# Patient Record
Sex: Female | Born: 1995 | Race: Black or African American | Hispanic: No | Marital: Single | Smoking: Former smoker
Health system: Southern US, Community
[De-identification: ages and names within clinical notes are randomized; demographics above are authoritative.]

## PROBLEM LIST (undated history)

## (undated) DIAGNOSIS — T85848A Pain due to other internal prosthetic devices, implants and grafts, initial encounter: Secondary | ICD-10-CM

## (undated) DIAGNOSIS — J45909 Unspecified asthma, uncomplicated: Secondary | ICD-10-CM

## (undated) HISTORY — PX: TONSILLECTOMY: SUR1361

## (undated) HISTORY — PX: CHOLECYSTECTOMY: SHX55

---
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
## 2013-08-10 ENCOUNTER — Ambulatory Visit: Payer: 59 | Admitting: Internal Medicine

## 2013-08-10 ENCOUNTER — Ambulatory Visit: Payer: 59

## 2013-08-10 VITALS — BP 128/70 | HR 89 | Temp 98.8°F | Resp 18 | Ht 62.4 in | Wt 254.8 lb

## 2013-08-10 DIAGNOSIS — M25469 Effusion, unspecified knee: Secondary | ICD-10-CM

## 2013-08-10 DIAGNOSIS — Z6841 Body Mass Index (BMI) 40.0 and over, adult: Secondary | ICD-10-CM

## 2013-08-10 DIAGNOSIS — M25462 Effusion, left knee: Secondary | ICD-10-CM

## 2013-08-10 DIAGNOSIS — M25569 Pain in unspecified knee: Secondary | ICD-10-CM

## 2013-08-10 DIAGNOSIS — M25562 Pain in left knee: Secondary | ICD-10-CM

## 2013-08-10 MED ORDER — HYDROCODONE-ACETAMINOPHEN 5-325 MG PO TABS: 1.0000 | ORAL_TABLET | Freq: Four times a day (QID) | ORAL | Status: DC | PRN

## 2013-08-10 NOTE — Progress Notes (Signed)
Subjective:     Patient ID: Samantha Allen, female   DOB: 1996/03/10, 18 y.o.   MRN: [id number]  Knee Pain    18 YO AA female [facility] with left knee pain after landing awkwardly during a basketball game yesterday evening. She states she went up for a rebound and as she came down she felt a pop in her knee and immediately fell to the ground. She does not remember the exact mechanism of the fall but states that she is having trouble weight bearing, is walking with a limp, has had her knee give out on her, but denies any locking of the knee. She has never hurt her left knee before and has no surgeries in the past that involve her knees. He has tried taking ibuprofen for the pain with minimal relief.    Review of Systems  Constitutional: Positive for activity change. Negative for fever, chills, diaphoresis, appetite change and fatigue.  HENT: Negative for congestion, ear pain, postnasal drip, rhinorrhea and sinus pressure.   Eyes: Negative for pain and redness.  Respiratory: Negative for cough, chest tightness, shortness of breath and wheezing.   Cardiovascular: Negative for chest pain.       Left knee swelling  Gastrointestinal: Negative for nausea, abdominal pain, diarrhea, constipation and abdominal distention.  Genitourinary: Negative for dysuria, urgency and hematuria.  Musculoskeletal: Positive for joint swelling. Negative for arthralgias and myalgias.  Skin: Negative for rash.  Neurological: Negative for dizziness, weakness, light-headedness and headaches.   Current Outpatient Prescriptions on File Prior to Visit  Medication Sig Dispense Refill  . ibuprofen (ADVIL,MOTRIN) 200 MG tablet Take 200 mg by mouth every 6 (six) hours as needed. Pain/ headache       No current facility-administered medications on file prior to visit.   Past Medical History  Diagnosis Date  . Pain from implanted hardware 11/2011    retained syndesmosis screw right ankle       Objective:   Physical  Exam  Constitutional: She is oriented to person, place, and time. She appears well-developed and well-nourished. No distress.  HENT:  Head: Normocephalic and atraumatic.  Right Ear: External ear normal.  Left Ear: External ear normal.  Mouth/Throat: Oropharynx is clear and moist.  Eyes: Conjunctivae are normal. Pupils are equal, round, and reactive to light.  Cardiovascular: Normal rate, regular rhythm, normal heart sounds and intact distal pulses.  Exam reveals no gallop and no friction rub.   No murmur heard. Pulmonary/Chest: Effort normal and breath sounds normal. No respiratory distress. She has no wheezes. She has no rales.  Abdominal: Soft. Bowel sounds are normal. She exhibits no distension. There is no tenderness. There is no rebound and no guarding.  Musculoskeletal: She exhibits edema and tenderness.  Decreased ROM with flexion and extension of the left knee Significant tenderness along medial and lateral joint lines with anterior medial effusion unable to fully weight bear with very abnormal gait Significant laxity with lachman's and anterior drawer Pain with valgus and varus stress test  Neurological: She is alert and oriented to person, place, and time. No cranial nerve deficit.  Skin: Skin is warm and dry. No rash noted. She is not diaphoretic. No erythema.  BP 128/70  Pulse 89  Temp(Src) 98.8 F (37.1 C) (Oral)  Resp 18  Ht 5' 2.4" (1.585 m)  Wt 254 lb 12.8 oz (115.577 kg)  BMI 46.01 kg/m2  SpO2 98%  LMP 08/05/2013  [facility] reading (PRIMARY) by  Dr.Lynzi Meulemans=no bony abnormality  Assessment:     Left knee pain - Plan: DG Knee Complete 4 Views Left  Knee effusion, left - Plan: DG Knee Complete 4 Views Left       Plan:     Meds ordered this encounter  Medications  . HYDROcodone-acetaminophen (NORCO) 5-325 MG per tablet    Sig: Take 1 tablet by mouth every 6 (six) hours as needed for moderate pain.    Dispense:  20 tablet    Refill:  0   MRI to r/o  suspected Int derang Straight leg immobilizer Crutches with nonweightbearing     I have completed the patient encounter in its entirety as documented by MS4 Adams-Teagan Ozawa, with editing by me where necessary. Candiace West P. Merla Richesoolittle, M.D.

## 2013-08-12 ENCOUNTER — Telehealth: Payer: Self-pay | Admitting: *Deleted

## 2013-08-12 ENCOUNTER — Other Ambulatory Visit: Payer: Self-pay | Admitting: Radiology

## 2013-08-12 DIAGNOSIS — M25569 Pain in unspecified knee: Secondary | ICD-10-CM

## 2013-08-12 NOTE — Telephone Encounter (Signed)
Discussed with Dr Merla Richesoolittle, will refer to [facility] ? ACL tear needs ortho eval and they can order MRI scan.

## 2013-08-12 NOTE — Telephone Encounter (Signed)
Peer to Peer review requested. Patient ID [id number] Case # [id number] Phone number: [phone] option 3  Review required due to original request has not demonstrated consistency with evidence based clinical guidelines.   MRI Left knee without contrast

## 2013-08-31 ENCOUNTER — Inpatient Hospital Stay: Admission: RE | Admit: 2013-08-31 | Payer: 59 | Source: Ambulatory Visit

## 2013-09-10 ENCOUNTER — Encounter (HOSPITAL_COMMUNITY): Payer: Self-pay | Admitting: Emergency Medicine

## 2013-09-10 ENCOUNTER — Emergency Department (HOSPITAL_COMMUNITY): Payer: 59

## 2013-09-10 ENCOUNTER — Emergency Department (HOSPITAL_COMMUNITY)
Admission: EM | Admit: 2013-09-10 | Discharge: 2013-09-11 | Disposition: A | Discharge status: Home / Self Care | Payer: 59 | Attending: Emergency Medicine | Admitting: Emergency Medicine

## 2013-09-10 DIAGNOSIS — X500XXA Overexertion from strenuous movement or load, initial encounter: Secondary | ICD-10-CM | POA: Insufficient documentation

## 2013-09-10 DIAGNOSIS — S8992XA Unspecified injury of left lower leg, initial encounter: Secondary | ICD-10-CM

## 2013-09-10 DIAGNOSIS — M25562 Pain in left knee: Secondary | ICD-10-CM

## 2013-09-10 DIAGNOSIS — Y9389 Activity, other specified: Secondary | ICD-10-CM | POA: Insufficient documentation

## 2013-09-10 DIAGNOSIS — Y929 Unspecified place or not applicable: Secondary | ICD-10-CM | POA: Insufficient documentation

## 2013-09-10 DIAGNOSIS — F17200 Nicotine dependence, unspecified, uncomplicated: Secondary | ICD-10-CM | POA: Insufficient documentation

## 2013-09-10 DIAGNOSIS — IMO0002 Reserved for concepts with insufficient information to code with codable children: Secondary | ICD-10-CM | POA: Insufficient documentation

## 2013-09-10 IMAGING — CR DG KNEE COMPLETE 4+V*L*
4 series · 4 of 4 positions shown · non-contrast
Comparison: None.

CLINICAL DATA: Injury to left knee while playing basketball. Left
knee pain.

EXAM:
LEFT KNEE - COMPLETE 4+ VIEW

[t knee ap left]
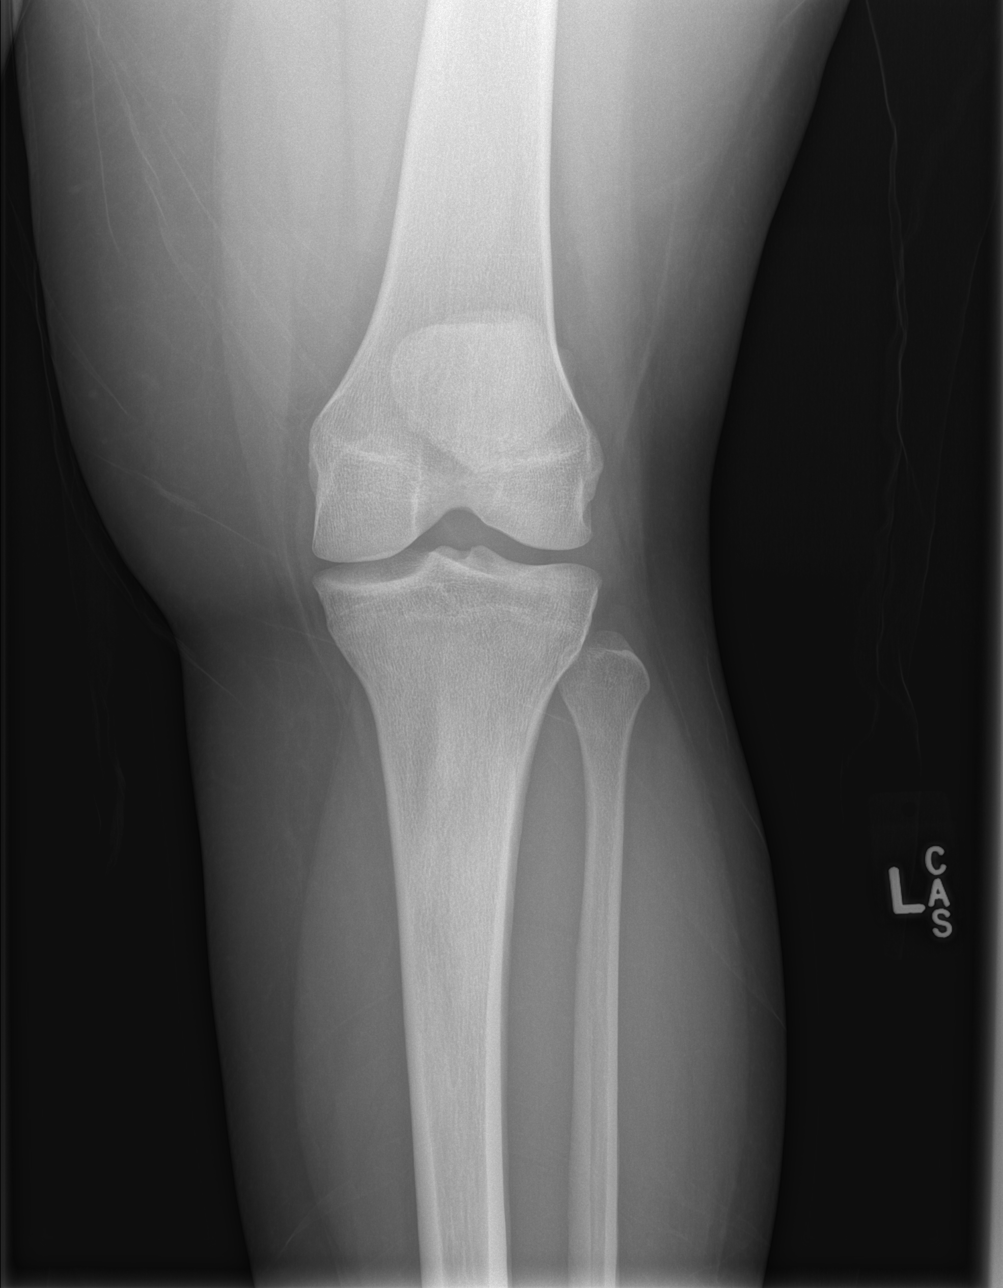

[t knee obl left (1 of 2)]
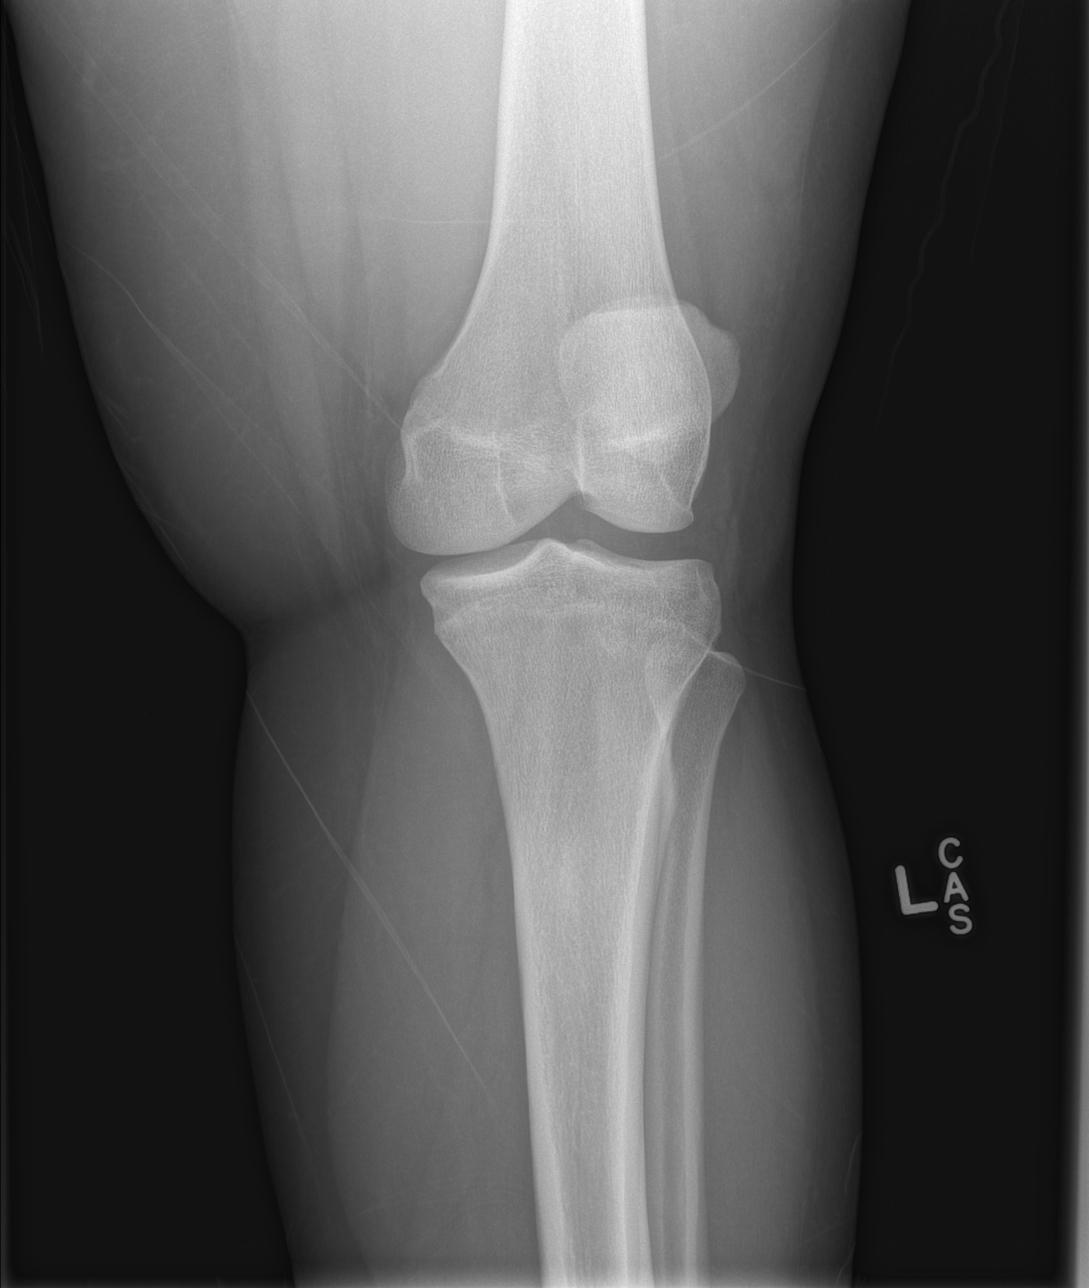

[t knee obl left (2 of 2)]
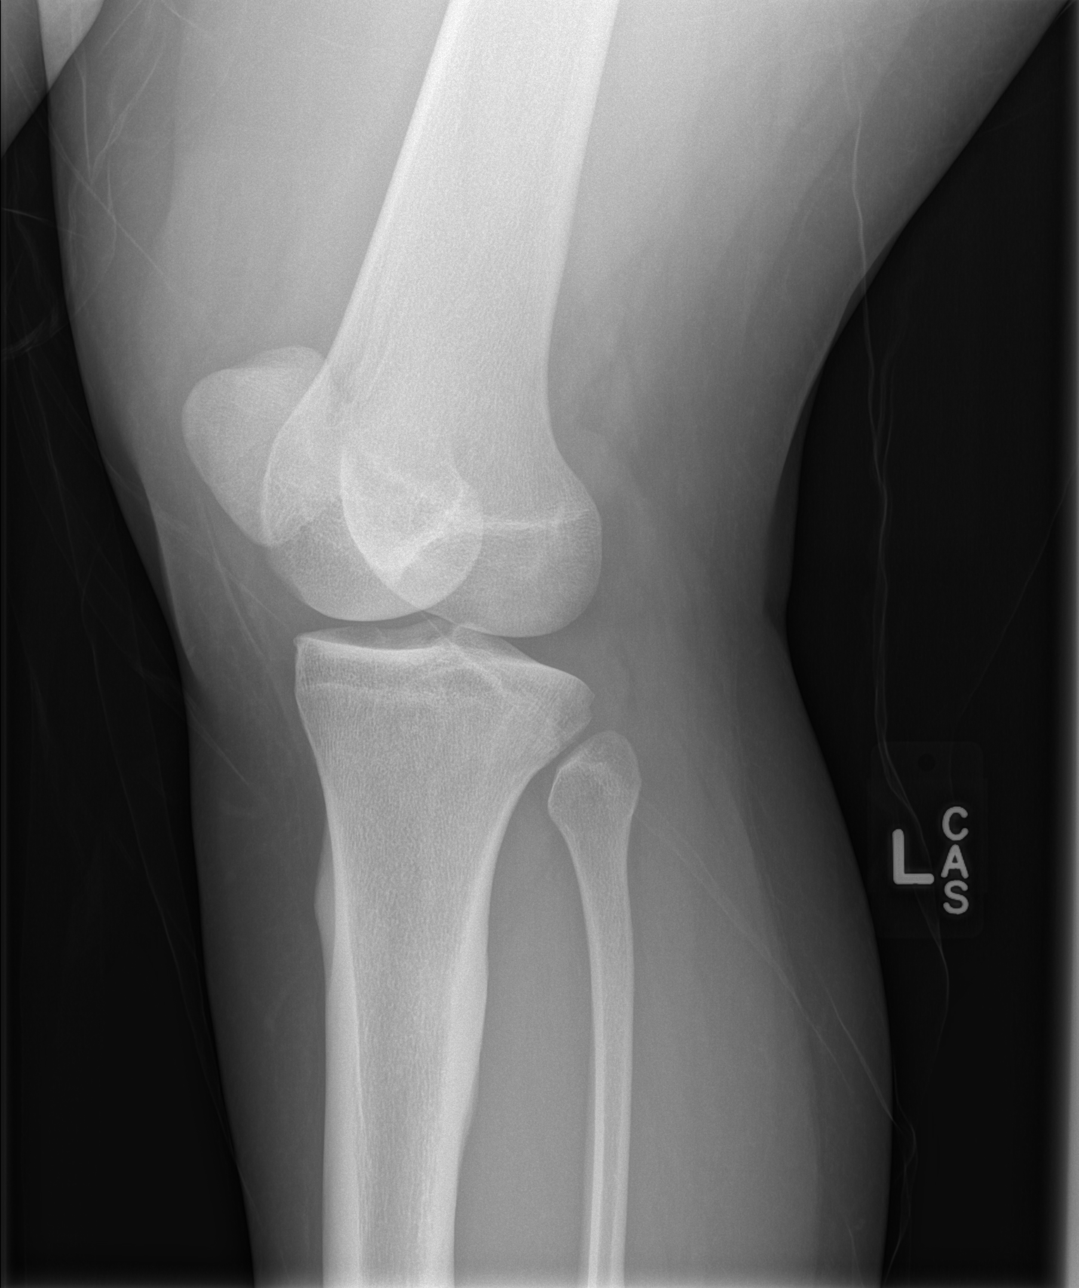

[t knee lat left]
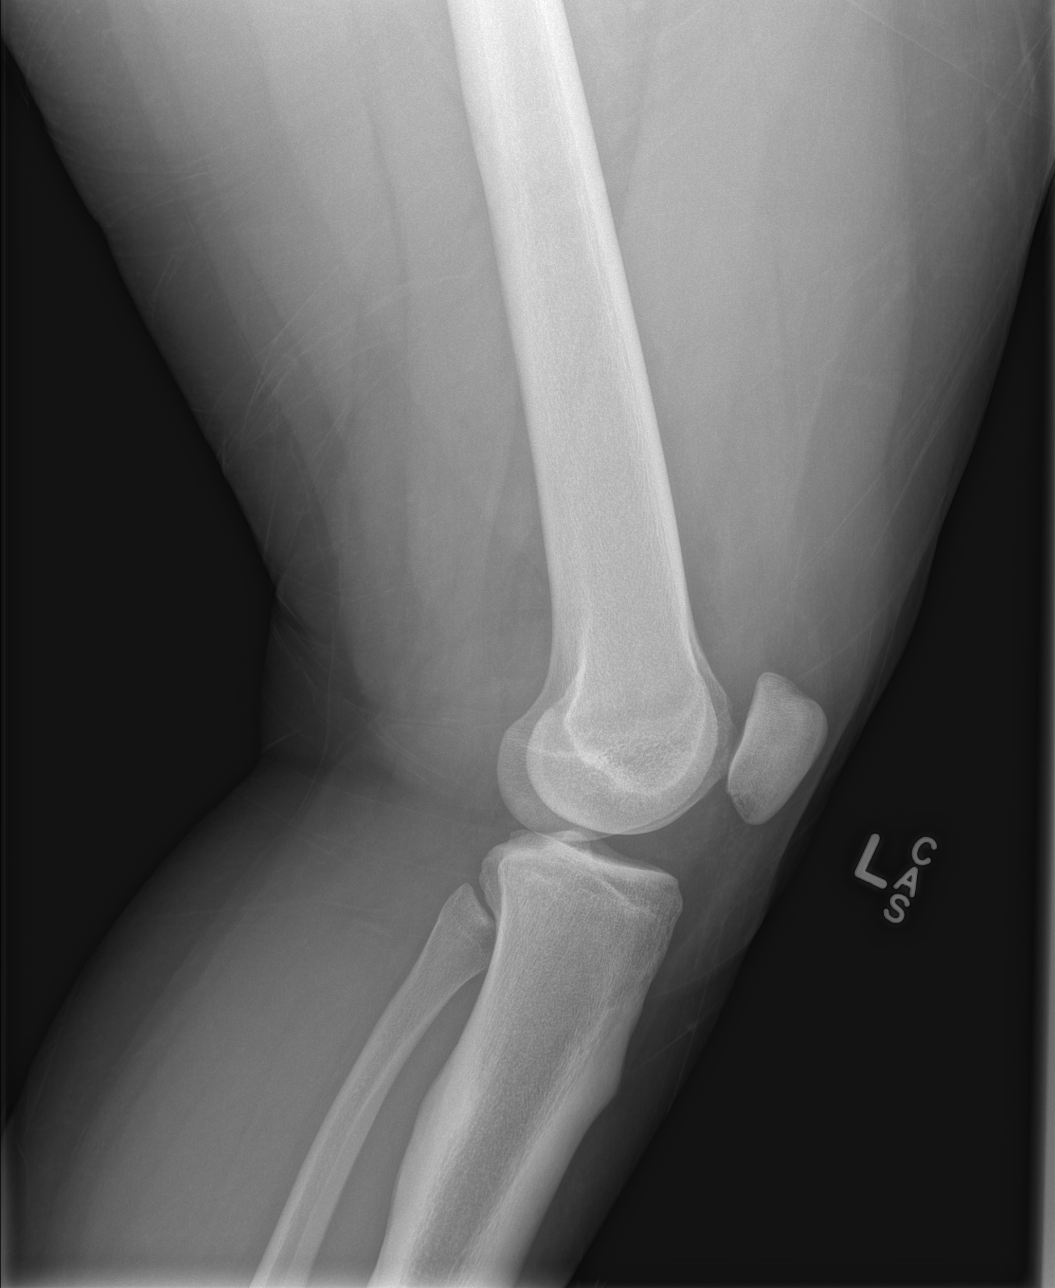

[4 of 4 positions shown; findings below may reference images not displayed]

FINDINGS: There is no evidence of fracture or dislocation. The joint spaces
are preserved. No significant degenerative change is seen; the
patellofemoral joint is grossly unremarkable in appearance.

A small knee joint effusion is noted. The visualized soft tissues
are otherwise unremarkable in appearance.
IMPRESSION: 1. No evidence of fracture or dislocation.
2. Small knee joint effusion noted.

## 2013-09-10 MED ORDER — NAPROXEN 500 MG PO TABS: 500.0000 mg | ORAL_TABLET | Freq: Two times a day (BID) | ORAL | Status: DC

## 2013-09-10 MED ORDER — TRAMADOL HCL 50 MG PO TABS: 50.0000 mg | ORAL_TABLET | Freq: Four times a day (QID) | ORAL | Status: DC | PRN

## 2013-09-10 NOTE — ED Provider Notes (Signed)
CSN: [id number]     Arrival date & time 09/10/13  2038 History   None    This chart was scribed for non-physician practitioner, Antony MaduraKelly Abria Vannostrand, PA-C working with Loren Raceravid Yelverton, MD by Arlan OrganAshley Leger, ED Scribe. This patient was seen in room WTR5/WTR5 and the patient's care was started at 11:13 PM.   Chief Complaint  Patient presents with  . Knee Pain   HPI  HPI Comments: Samantha CotaBriana Allen is a 18 y.o. female who presents to the Emergency Department complaining of constant, moderate L knee pain x 1 day that has progressively worsened. She has also noted some swelling to the knee. Pt states she injured her knee about 1 month ago while playing basketball and says it has not "felt right" since onset of injury. States when she was jogging yesterday she felt her knee "shift". She states this new pain is exacerbated with ambulation, certain movements, flexion of the knee, and palpation. She has tried OTC Tylenol without any improvement. She has also tried ice to the area along with elevating her knee without any relief.   At this time she denies any fever, chills, numbness, loss of sensation, or tingling. She has no pertinent past medical history. No other concerns this visit.   Past Medical History  Diagnosis Date  . Pain from implanted hardware 11/2011    retained syndesmosis screw right ankle   Past Surgical History  Procedure Laterality Date  . Cholecystectomy    . Tonsillectomy    . Orif ankle fracture  07/2011    right  . Hardware removal  11/22/2011    Procedure: HARDWARE REMOVAL;  Surgeon: Toni ArthursJohn Hewitt, MD;  Location:  [facility];  Service: Orthopedics;  Laterality: Right;   Family History  Problem Relation Age of Onset  . Anesthesia problems Mother     post-op N/V   History  Substance Use Topics  . Smoking status: Current Some Day Smoker  . Smokeless tobacco: Never Used  . Alcohol Use: No   OB History   Grav Para Term Preterm Abortions TAB SAB Ect Mult Living                   Review of Systems  Constitutional: Negative for fever and chills.  HENT: Negative for congestion.   Eyes: Negative for redness.  Respiratory: Negative for cough.   Musculoskeletal: Positive for arthralgias (L knee).  Skin: Negative for rash.  Neurological: Negative for numbness.  Psychiatric/Behavioral: Negative for confusion.     Allergies  Review of patient's allergies indicates no known allergies.  Home Medications   Prior to Admission medications   Medication Sig Start Date End Date Taking? Authorizing Provider  HYDROcodone-acetaminophen (NORCO) 5-325 MG per tablet Take 1 tablet by mouth every 6 (six) hours as needed for moderate pain. 08/10/13   Tonye Pearsonobert P Doolittle, MD  ibuprofen (ADVIL,MOTRIN) 200 MG tablet Take 200 mg by mouth every 6 (six) hours as needed. Pain/ headache    Historical Provider, MD  naproxen (NAPROSYN) 500 MG tablet Take 1 tablet (500 mg total) by mouth 2 (two) times daily. 09/10/13   Antony MaduraKelly Kellen Dutch, PA-C  traMADol (ULTRAM) 50 MG tablet Take 1 tablet (50 mg total) by mouth every 6 (six) hours as needed. 09/10/13   Antony MaduraKelly Ardenia Stiner, PA-C   Triage Vitals: BP 123/84  Pulse 84  Temp(Src) 98.1 F (36.7 C) (Oral)  Resp 14  SpO2 97%  LMP 08/17/2013   Physical Exam  Nursing note and vitals reviewed. Constitutional: She  is oriented to person, place, and time. She appears well-developed and well-nourished. No distress.  HENT:  Head: Normocephalic and atraumatic.  Eyes: Conjunctivae and EOM are normal. No scleral icterus.  Neck: Normal range of motion.  Cardiovascular: Normal rate, regular rhythm and intact distal pulses.   Pulses:      Dorsalis pedis pulses are 2+ on the left side.       Posterior tibial pulses are 2+ on the left side.  Pulmonary/Chest: Effort normal. No respiratory distress.  Abdominal: She exhibits no distension.  Musculoskeletal: Normal range of motion. She exhibits tenderness.       Left knee: She exhibits swelling (mild). She exhibits normal  range of motion, no effusion, no ecchymosis, no deformity, no erythema, normal alignment, no LCL laxity, no bony tenderness and no MCL laxity. Tenderness found. Medial joint line tenderness noted.       Left upper leg: Normal.       Left lower leg: Normal.  Full PROM of L knee. TTP to medial joint line. No bony TTP. No laxity, crepitus, deformity, or effusion.  Neurological: She is alert and oriented to person, place, and time. She has normal strength. No sensory deficit. GCS eye subscore is 4. GCS verbal subscore is 5. GCS motor subscore is 6.  Reflex Scores:      Patellar reflexes are 2+ on the left side.      Achilles reflexes are 2+ on the left side. No numbness, tingling or weakness of the affected extremity. 5/5 strength against resistance with L knee flexion and extension.  Skin: Skin is warm and dry. No rash noted. She is not diaphoretic. No erythema. No pallor.  Psychiatric: She has a normal mood and affect. Her behavior is normal.    ED Course  Procedures (including critical care time)  DIAGNOSTIC STUDIES: Oxygen Saturation is 97% on RA, Normal by my interpretation.    COORDINATION OF CARE: 11:22 PM- Will order DG Knee complete 4 view L. Discussed treatment plan with pt at bedside and pt agreed to plan.     Labs Review Labs Reviewed - No data to display  Imaging Review Dg Knee Complete 4 Views Left  09/10/2013   CLINICAL DATA:  Injury to left knee while playing basketball. Left knee pain.  EXAM: LEFT KNEE - COMPLETE 4+ VIEW  COMPARISON:  None.  FINDINGS: There is no evidence of fracture or dislocation. The joint spaces are preserved. No significant degenerative change is seen; the patellofemoral joint is grossly unremarkable in appearance.  A small knee joint effusion is noted. The visualized soft tissues are otherwise unremarkable in appearance.  IMPRESSION: 1. No evidence of fracture or dislocation. 2. Small knee joint effusion noted.   Electronically Signed   By: Roanna Raider M.D.   On: 09/10/2013 21:56     EKG Interpretation None      MDM   Final diagnoses:  Injury of ligament of left knee  Knee pain, left    Uncomplicated left knee pain. Patient neurovascularly intact in physical exam. No gross sensory deficits appreciated. No evidence of septic joint; patient exhibits full passive range of motion of her left knee. She relates with normal gait. X-ray today is negative for fracture or dislocation. Knee sleeve applied in ED and RICE advised. Will refer patient to orthopedics for further evaluation of symptoms. Return precautions discussed and provided. Patient agreeable to plan with no unaddressed concerns.  I personally performed the services described in this documentation, which was scribed  in my presence. The recorded information has been reviewed and is accurate.   Filed Vitals:   09/10/13 2104 09/11/13 0003  BP: 123/84 140/85  Pulse: 84 70  Temp: 98.1 F (36.7 C)   TempSrc: Oral   Resp: 14 18  SpO2: 97% 100%     Antony Madura, PA-C 09/13/13 [phone]

## 2013-09-10 NOTE — Discharge Instructions (Signed)
Recommend naproxen as prescribed. You may take tramadol as prescribed for severe pain. Use a knee sleeve or Ace wrap for stability. Continue with ice, rest, and elevation as described below. Followup with orthopedics. Return if symptoms worsen.  RICE: Routine Care for Injuries Rest, Ice, Compression, and Elevation (RICE) are often used to care for injuries. HOME CARE  Rest your injury.  Put ice on the injury.  Put ice in a plastic bag.  Place a towel between your skin and the bag.  Leave the ice on for 15-20 minutes, 03-04 times a day. Do this for as long as told by your doctor.  Apply pressure (compression) with an elastic bandage. Remove and reapply the bandage every 3 to 4 hours. Do not wrap the bandage too tight. Wrap the bandage looser if the fingers or toes are puffy (swollen), blue, cold, painful, or lose feeling (numb).  Raise (elevate) your injury. Raise your injury above the heart if you can. GET HELP RIGHT AWAY IF:  You have lasting pain or puffiness.  Your injury is red, weak, or loses feeling.  Your problems get worse, not better, after several days. MAKE SURE YOU:  Understand these instructions.  Will watch your condition.  Will get help right away if you are not doing well or get worse. Document Released: 09/19/2007 Document Revised: 06/25/2011 Document Reviewed: 09/01/2010 [facility] Patient Information 2014 [place], [place].  Medial Collateral Knee Ligament Sprain  with Phase I Rehab The medial collateral ligament (MCL) of the knee helps hold the knee joint in proper alignment and prevents the bones from shifting out of alignment (displacing) to the inside (medially). Injury to the knee may cause a tear in the MCL ligament (sprain). Sprains may heal without treatment, but this often results in a loose joint. Sprains are classified into three categories. Grade 1 sprains cause pain, but the tendon is not lengthened. Grade 2 sprains include a lengthened ligament, due  to the ligament being stretched or partially ruptured. With grade 2 sprains, there is still function, although possibly decreased. Grade 3 sprains involve a complete tear of the tendon or muscle, and function is usually impaired. SYMPTOMS   Pain and tenderness on the inner side of the knee.  A "pop," tearing or pulling sensation at the time of injury.  Bruising (contusion) at the site of injury, within 48 hours of injury.  Knee stiffness.  Limping, often walking with the knee bent. CAUSES  An MCL sprain occurs when a force is placed on the ligament that is greater than it can handle. Common mechanisms of injury include:  Direct hit (trauma) to the outer side of the knee, especially if the foot is planted on the ground.  Forceful pivoting of the body and leg, while the foot is planted on the ground. RISK INCREASES WITH:  Contact sports (football, rugby).  Sports that require pivoting or cutting (soccer).  Poor knee strength and flexibility.  Improper equipment use. PREVENTION  Warm up and stretch properly before activity.  Maintain physical fitness:  Strength, flexibility and endurance.  Cardiovascular fitness.  Wear properly fitted protective equipment (correct length of cleats for surface).  Functional braces may be effective in preventing injury. PROGNOSIS  MCL tears usually heal without the need for surgery. Sometimes however, surgery is required. RELATED COMPLICATIONS  Frequently recurring symptoms, such as the knee giving way, knee instability or knee swelling.  Injury to other structures in the knee joint:  Meniscal cartilage, resulting in locking and swelling of the knee.  Articular cartilage, resulting in knee arthritis.  Other ligaments of the knee.  Injury to nerves, resulting in numbness of the outer leg, foot or ankle and weakness or paralysis, with inability to raise the ankle or toes.  Knee stiffness. TREATMENT Treatment first involves the use  of ice and medicine, to reduce pain and inflammation. The use of strengthening and stretching exercises may help reduce pain with activity. These exercises may be performed at home, but referral to a therapist is often advised. You may be advised to walk with crutches until you are able to walk without a limp. Your caregiver may provide you with a hinged knee brace to help regain a full range of motion, while also protecting the injured knee. For severe MCL injuries or injuries that involve other ligaments of the knee, surgery is often advised. MEDICATION  Do not take pain medicine for 7 days before surgery.  Only use over-the-counter pain medicine as directed by your caregiver.  Only use prescription pain relievers as directed and only in needed amounts. HEAT AND COLD  Cold treatment (icing) should be applied for 10 to 15 minutes every 2 to 3 hours for inflammation and pain, and immediately after any activity, that aggravates the symptoms. Use ice packs or an ice massage.  Heat treatment may be used before performing stretching and strengthening activities prescribed by your caregiver, physical therapist or athletic trainer. Use a heat pack or warm water soak. SEEK MEDICAL CARE IF:   Symptoms get worse or do not improve in 4 to 6 weeks, despite treatment.  New, unexplained symptoms develop. EXERCISES  PHASE I EXERCISES  RANGE OF MOTION (ROM) AND STRETCHING EXERCISES Medial Collateral Knee Ligament Sprain Phase I These are some of the initial exercises that your physician, physical therapist or athletic trainer may have you perform to begin your rehabilitation. When you demonstrate gains in your flexibility and strength, your caregiver may progress you to Phase II exercises. As you perform these exercises, remember:  These initial exercises are intended to be gentle. They will help you restore motion without increasing any swelling.  Completing these exercises allows less painful movement  and prepares you for the more aggressive strengthening exercises in Phase II.  An effective stretch should be held for at least 30 seconds.  A stretch should never be painful. You should only feel a gentle lengthening or release in the stretched tissue. RANGE OF MOTION Knee Flexion, Active  Lie on your back with both knees straight. (If this causes back discomfort, bend your healthy knee, placing your foot flat on the floor.)  Slowly slide your heel back toward your buttocks until you feel a gentle stretch in the front of your knee or thigh.  Hold for __________ seconds. Slowly slide your heel back to the starting position. Repeat __________ times. Complete this exercise __________ times per day. STRETCH Knee Flexion, Supine  Lie on the floor with your right / left heel and foot lightly touching the wall. (Place both feet on the wall if you do not use a door frame.)  Without using any effort, allow gravity to slide your foot down the wall slowly until you feel a gentle stretch in the front of your right / left knee.  Hold this stretch for __________ seconds. Then return the leg to the starting position, using your health leg for help, if needed. Repeat __________ times. Complete this stretch __________ times per day. RANGE OF MOTION Knee Flexion and Extension, Active-Assisted  Sit on the  edge of a table or chair with your thighs firmly supported. It may be helpful to place a folded towel under the end of your right / left thigh.  Flexion (bending): Place the ankle of your healthy leg on top of the other ankle. Use your healthy leg to gently bend your right / left knee until you feel a mild tension across the top of your knee.  Hold for __________ seconds.  Extension (straightening): Switch your ankles so your right / left leg is on top. Use your healthy leg to straighten your right / left knee until you feel a mild tension on the backside of your knee.  Hold for __________  seconds. Repeat __________ times. Complete this exercise __________ times per day. STRETCH Knee Extension Sitting  Sit with your right / left leg/heel propped on another chair, coffee table, or foot stool.  Allow your leg muscles to relax, letting gravity straighten out your knee.*  You should feel a stretch behind your right / left knee. Hold this position for __________ seconds. Repeat __________ times. Complete this stretch __________ times per day. *Your physician, physical therapist or athletic trainer may instruct you to place a __________ weight on your thigh, just above your kneecap, to deepen the stretch. STRENGTHENING EXERCISES Medial Collateral Knee Ligament Sprain Phase I These exercises may help you when beginning to rehabilitate your injury. They may resolve your symptoms with or without further involvement from your physician, physical therapist or athletic trainer. While completing these exercises, remember:   In order to return to more demanding activities, you will likely need to progress to more challenging exercises. Your physician, physical therapist or athletic trainer will advance your exercises when your tissues show adequate healing and your muscles demonstrate increased strength.  Muscles can gain both the endurance and the strength needed for everyday activities through controlled exercises.  Complete these exercises as instructed by your physician, physical therapist or athletic trainer. Increase the resistance and repetitions only as guided by your caregiver. STRENGTH Quadriceps, Isometrics  Lie on your back with your right / left leg extended and your opposite knee bent.  Gradually tense the muscles in the front of your right / left thigh. You should see either your kneecap slide up toward your hip or an increased dimpling just above the knee. This motion will push the back of the knee down toward the floor, mat or bed on which you are lying.  Hold the muscle as  tight as you can without increasing your pain for __________ seconds.  Relax the muscles slowly and completely in between each repetition. Repeat __________ times. Complete this exercise __________ times per day. STRENGTH Quadriceps, Short Arcs  Lie on your back. Place a __________ inch towel roll under your knee so that the knee slightly bends.  Raise only your lower leg by tightening the muscles in the front of your thigh. Do not allow your thigh to rise.  Hold this position for __________ seconds. Repeat __________ times. Complete this exercise __________ times per day. OPTIONAL ANKLE WEIGHTS: Begin with ____________________, but DO NOT exceed ____________________. Increase in 1 pound/0.5 kilogram increments.  STRENGTH- Quadriceps, Straight Leg Raises Quality counts! Watch for signs that the quadriceps muscle is working, to be sure you are strengthening the correct muscles and not "cheating" by substituting with healthier muscles.  Lay on your back with your right / left leg extended and your opposite knee bent.  Tense the muscles in the front of your right / left  thigh. You should see either your knee cap slide up or increased dimpling just above the knee. Your thigh may even shake a bit.  Tighten these muscles even more and raise your leg 4 to 6 inches off the floor. Hold for __________ seconds.  Keeping these muscles tense, lower your leg.  Relax the muscles slowly and completely in between each repetition. Repeat __________ times. Complete this exercise __________ times per day. STRENGTH Hamstring, Isometrics  Lie on your back on a firm surface.  Bend your right / left knee approximately __________ degrees.  Dig your heel into the surface as if you are trying to pull it toward your buttocks. Tighten the muscles in the back of your thighs to "dig" as hard as you can, without increasing any pain.  Hold this position for __________ seconds.  Release the tension gradually and  allow your muscle to completely relax for __________ seconds in between each exercise. Repeat __________ times. Complete this exercise __________ times per day. STRENGTH Hamstring, Curls  Lay on your stomach with your legs extended. (If you lay on a bed, your feet may hang over the edge.)  Tighten the muscles in the back of your thigh to bend your right / left knee up to 90 degrees. Keep your hips flat on the bed.  Hold this position for __________ seconds.  Slowly lower your leg back to the starting position. Repeat __________ times. Complete this exercise __________ times per day. OPTIONAL ANKLE WEIGHTS: Begin with ____________________, but DO NOT exceed ____________________. Increase in 1 pound/0.5 kilogram increments.  Document Released: 04/02/2005 Document Revised: 06/25/2011 Document Reviewed: 07/15/2008 [facility] Patient Information 2014 [place], [place].

## 2013-09-10 NOTE — ED Notes (Signed)
Pt states that she injured her knee approx 1 month ago and that it has not "felt right"since; pt states that she was jogging yesterday and knee felt like it was "stuck" or "not in place" pt states that it felt like her leg had shifted; pt states increase pain since then.

## 2013-09-23 NOTE — ED Provider Notes (Signed)
Medical screening examination/treatment/procedure(s) were performed by non-physician practitioner and as supervising physician I was immediately available for consultation/collaboration.   EKG Interpretation None        Sanjuanita Condrey, MD 09/23/13 0207 

## 2014-02-24 ENCOUNTER — Inpatient Hospital Stay
Admit: 2014-02-24 | Discharge: 2014-02-24 | Disposition: A | Discharge status: Home / Self Care | Payer: PRIVATE HEALTH INSURANCE | Attending: Student in an Organized Health Care Education/Training Program

## 2014-02-24 DIAGNOSIS — J45901 Unspecified asthma with (acute) exacerbation: Secondary | ICD-10-CM

## 2014-02-24 MED ORDER — IPRATROPIUM-ALBUTEROL 2.5 MG-0.5 MG/3 ML NEB SOLUTION
2.5 mg-0.5 mg/3 ml | RESPIRATORY_TRACT | Status: AC
Start: 2014-02-24 — End: 2014-02-24
  Administered 2014-02-24: 19:00:00 via RESPIRATORY_TRACT

## 2014-02-24 MED ORDER — IBUPROFEN 400 MG TAB
400 mg | ORAL | Status: AC
Start: 2014-02-24 — End: 2014-02-24
  Administered 2014-02-24: 19:00:00 via ORAL

## 2014-02-24 MED ORDER — PREDNISONE 20 MG TAB
20 mg | ORAL | Status: AC
Start: 2014-02-24 — End: 2014-02-24
  Administered 2014-02-24: 19:00:00 via ORAL

## 2014-02-24 MED ORDER — PREDNISONE 20 MG TAB
20 mg | ORAL_TABLET | Freq: Every day | ORAL | Status: AC
Start: 2014-02-24 — End: 2014-02-28

## 2014-02-24 MED ORDER — ALBUTEROL SULFATE 0.083 % (0.83 MG/ML) SOLN FOR INHALATION
2.5 mg /3 mL (0.083 %) | RESPIRATORY_TRACT | Status: AC | PRN
Start: 2014-02-24 — End: ?

## 2014-02-24 MED FILL — ALBUTEROL SULFATE 0.083 % (0.83 MG/ML) SOLN FOR INHALATION: 2.5 mg /3 mL (0.083 %) | RESPIRATORY_TRACT | Qty: 1

## 2014-02-24 MED FILL — PREDNISONE 20 MG TAB: 20 mg | ORAL | Qty: 3

## 2014-02-24 MED FILL — IBUPROFEN 400 MG TAB: 400 mg | ORAL | Qty: 2

## 2014-02-24 NOTE — ED Notes (Signed)
Patient aware of plan of care

## 2014-02-24 NOTE — ED Notes (Signed)
Patient education given on home wheezing management and the patient expresses understanding and acceptance of instructions. Joretta BachelorJOSEPH J HULSEBUSCH, RN 02/24/2014 3:58 PM  Pt discharged home with parent/gaurdian.  Pt acting age appropriately, respirations regular and unlabored, cap refill less than two seconds.  Parent/gaurdian verbalized understanding of discharge paperwork and has no further questions at this time.

## 2014-02-24 NOTE — ED Provider Notes (Signed)
HPI Comments: 18 y/o female with hx asthma here with cough, chest pain, wheezing and sob; She has had these symptoms for the past 1 week. She has not needed any albuterol in about 2 years and she does not know have any albuterol for her neb machine so she has had no treatments. No fever; no v/d; She has upper chest pain intermittently, none now. No sob here. No abdominal pain, back pain, ha, st. She has had a normal appetite, drinking well.     Pmh: asthma; cholecystectomy, t and a, pe tubes  Social: vaccines utd; lives at home with family    Patient is a 18 y.o. female presenting with cough. The history is provided by the patient.   Cough  Associated symptoms include chest pain, shortness of breath and wheezing.        Past Medical History   Diagnosis Date   ??? Asthma    ;     Past Surgical History   Procedure Laterality Date   ??? Hx heent       Tonsillectomy and Adenoidectomy   ??? Hx cholecystectomy     ??? Hx orthopaedic       Right Ankle         History reviewed. No pertinent family history.     History     Social History   ??? Marital Status: N/A     Spouse Name: N/A     Number of Children: N/A   ??? Years of Education: N/A     Occupational History   ??? Not on file.     Social History Main Topics   ??? Smoking status: Current Every Day Smoker   ??? Smokeless tobacco: Not on file   ??? Alcohol Use: Not on file   ??? Drug Use: Not on file   ??? Sexual Activity: Not on file     Other Topics Concern   ??? Not on file     Social History Narrative   ??? No narrative on file                ALLERGIES: Review of patient's allergies indicates not on file.      Review of Systems   Constitutional: Negative.  Negative for fever and appetite change.   HENT: Negative.    Respiratory: Positive for cough, chest tightness, shortness of breath and wheezing.    Cardiovascular: Positive for chest pain.   Gastrointestinal: Negative.    Genitourinary: Negative.    Skin: Negative.    Neurological: Negative.     All other systems reviewed and are negative.      Filed Vitals:    02/24/14 1239   BP: 113/76   Pulse: 82   Temp: 98.1 ??F (36.7 ??C)   Resp: 20   Weight: 117.3 kg (258 lb 9.6 oz)   SpO2: 97%            Physical Exam   Constitutional: She is oriented to person, place, and time. She appears well-developed and well-nourished.   HENT:   Head: Normocephalic.   Right Ear: External ear normal.   Left Ear: External ear normal.   Mouth/Throat: Oropharynx is clear and moist. No oropharyngeal exudate.   Eyes: Conjunctivae are normal. Pupils are equal, round, and reactive to light.   Neck: Normal range of motion. Neck supple.   Cardiovascular: Normal rate, regular rhythm and normal heart sounds.    Pulmonary/Chest: No respiratory distress. She has wheezes.   Diffuse wheezing bilateral  with good air exchange; no distress, no tachypnea or retractions. No rales;    Abdominal: Soft. Bowel sounds are normal. She exhibits no distension. There is no tenderness.   Musculoskeletal: Normal range of motion.   Neurological: She is alert and oriented to person, place, and time.   Skin: Skin is warm and dry.   Nursing note and vitals reviewed.       MDM  Number of Diagnoses or Management Options  Acute bronchitis, unspecified organism:   Asthma with exacerbation, unspecified asthma severity:   Diagnosis management comments: 18 y/o female with asthma, here with wheezing, exacerbation; no cp here, likely from wheezing and cough; no fever lung sounds or tachypnea to suggest pneumonia; will give duoneb, oral steroids.        Amount and/or Complexity of Data Reviewed  Obtain history from someone other than the patient: yes    Risk of Complications, Morbidity, and/or Mortality  Presenting problems: moderate  Diagnostic procedures: low  Management options: low        Procedures                                                    cxr negative; after duoneb, lungs cta, no wheezing, rales, chest  tenderness improved after motrin; will dc home with oral steroids, nebs q4 hour, f/u with pcp (list given to patient) or return precautions discussed.   Patient's results have been reviewed with them. Patient and /or family have verbally conveyed understanding and agreement of the patient's signs, symptoms, diagnosis, treatment and prognosis and additionally agree to follow up as recommended or return to the Emergency Department should their condition change prior to follow-up. Discharge instructions have also been provided to the patient with some educational information regarding their diagnosis as well as a list of reasons why they would want to return to the ER prior to their follow-up appointment should their condition change.

## 2014-02-24 NOTE — ED Notes (Signed)
Awake ,alert, breathing even and unlabored, scant expiratory wheeze noted, brisk cap refill, neuro intact

## 2014-02-24 NOTE — ED Notes (Signed)
Cough and cold symptoms for one week.  Now asthma exacerbation.  Patient states "I recently moved here and I left my inhaler and machine".  No treatments PTA.

## 2014-02-24 NOTE — ED Notes (Signed)
Patient updated on plan of care

## 2014-03-01 ENCOUNTER — Inpatient Hospital Stay
Admit: 2014-03-01 | Discharge: 2014-03-01 | Disposition: A | Discharge status: Home / Self Care | Payer: PRIVATE HEALTH INSURANCE | Attending: Emergency Medicine

## 2014-03-01 DIAGNOSIS — H578 Other specified disorders of eye and adnexa: Secondary | ICD-10-CM

## 2014-03-01 MED ORDER — DIPHENHYDRAMINE 50 MG CAP
50 mg | ORAL | Status: AC
Start: 2014-03-01 — End: 2014-03-01
  Administered 2014-03-01: 07:00:00 via ORAL

## 2014-03-01 MED ORDER — DIPHENHYDRAMINE 25 MG CAP
25 mg | ORAL_CAPSULE | Freq: Four times a day (QID) | ORAL | Status: AC | PRN
Start: 2014-03-01 — End: 2014-03-11

## 2014-03-01 MED ORDER — TOBRAMYCIN 0.3 % EYE DROPS
0.3 % | OPHTHALMIC | Status: AC
Start: 2014-03-01 — End: ?

## 2014-03-01 MED FILL — DIPHENHYDRAMINE 50 MG CAP: 50 mg | ORAL | Qty: 1

## 2014-03-01 NOTE — ED Notes (Signed)
Patient received and voiced understanding of discharge instructions, patient ambulate out w/o complaint, no distress noted.

## 2014-03-01 NOTE — ED Notes (Signed)
TRIAGE: Patient arrives from home for possible allergic reaction. She reports LEFT eye pain/redness/itching that began at approximately 0140 when she was trying to go to sleep. Patient reports some itchiness in her throat. She is speaking in complete sentences, handling secretions appropriately. She last ate queso & spinach/cherry tart approximately one hour ago.

## 2014-03-01 NOTE — ED Provider Notes (Signed)
HPI Comments: 18 yo female here for evaluation of swelling/itching to left eye.  +tearing. States she noticed symptoms around 0140 this evening.  Also with some itching to throat.  Pt was concerned she may be having an allergic reaction.    Denies CP, SOB, abd pain, flank pain, urinary symptoms.   No medications taken.    No known sick contacts.      Patient is a 18 y.o. female presenting with eye itching. The history is provided by the patient.   Itchy Eye   This is a new problem. The current episode started less than 1 hour ago. The problem occurs constantly. The left eye is affected. The injury mechanism was none. The pain is at a severity of 0/10. The patient is experiencing no pain. Associated symptoms include eye redness and itching. Pertinent negatives include no numbness, no blurred vision, no decreased vision, no discharge, no double vision, no photophobia, no weakness, no pain and no dizziness. She has tried nothing for the symptoms.        Past Medical History   Diagnosis Date   ??? Asthma    ;     Past Surgical History   Procedure Laterality Date   ??? Hx heent       Tonsillectomy and Adenoidectomy   ??? Hx cholecystectomy     ??? Hx orthopaedic       Right Ankle         History reviewed. No pertinent family history.     History     Social History   ??? Marital Status: SINGLE     Spouse Name: N/A     Number of Children: N/A   ??? Years of Education: N/A     Occupational History   ??? Not on file.     Social History Main Topics   ??? Smoking status: Current Every Day Smoker   ??? Smokeless tobacco: Not on file   ??? Alcohol Use: No   ??? Drug Use: Not on file   ??? Sexual Activity: Not on file     Other Topics Concern   ??? Not on file     Social History Narrative                ALLERGIES: Other food      Review of Systems   Constitutional: Negative.    HENT: Negative for ear discharge, trouble swallowing and voice change.    Eyes: Positive for redness and itching. Negative for blurred vision,  double vision, photophobia, pain, discharge and visual disturbance.   Respiratory: Negative for apnea, cough, chest tightness and shortness of breath.    Cardiovascular: Negative for chest pain, palpitations and leg swelling.   Gastrointestinal: Negative for abdominal pain, blood in stool and abdominal distention.   Genitourinary: Negative for dysuria, frequency, hematuria, flank pain and difficulty urinating.   Musculoskeletal: Negative for myalgias, back pain, joint swelling, gait problem and neck pain.   Skin: Positive for itching. Negative for color change and pallor.   Neurological: Negative for dizziness, syncope, weakness, numbness and headaches.   Psychiatric/Behavioral: Negative for behavioral problems and confusion. The patient is not nervous/anxious.        Filed Vitals:    03/01/14 0159   BP: 143/88   Temp: 98.9 ??F (37.2 ??C)   Resp: 18   Height: 5\' 3"  (1.6 m)   Weight: 118.3 kg (260 lb 12.9 oz)   SpO2: 100%  Physical Exam   Constitutional: She is oriented to person, place, and time. She appears well-nourished. No distress.   HENT:   Head: Normocephalic and atraumatic.   Eyes: Pupils are equal, round, and reactive to light.   Redness to left eye; tearing; small amt of swelling noted   Neck: Normal range of motion.   Cardiovascular: Normal rate and regular rhythm.    Pulmonary/Chest: Effort normal and breath sounds normal. No respiratory distress. She has no wheezes. She has no rales.   Abdominal: Soft. There is no tenderness.   Musculoskeletal: Normal range of motion. She exhibits no edema or tenderness.   Neurological: She is alert and oriented to person, place, and time.   Skin: Skin is warm and dry.   Psychiatric: She has a normal mood and affect.   Nursing note and vitals reviewed.       MDM  Number of Diagnoses or Management Options  Eye irritation:   Diagnosis management comments: Irritation to left eye x tonight; clear  drainage; mild swelling.  No pain to eye or changes in vision. No systemic symptoms. Increased drainage since in ER.  Probable early conjunctivitis; will treat and have follow up. Veverly FellsAlyse N Marshelle Bilger, PA        Procedures    Patient has been reassessed.  Feeling better; noted increased drainage left eye; will give RX for antibiotic drops.  Reviewed medications with patient.  Ready to discharge home.      Patient's results have been reviewed with them.  Patient and/or family have verbally conveyed their understanding and agreement of the patient's signs, symptoms, diagnosis, treatment and prognosis and additionally agree to follow up as recommended or return to the Emergency Room should their condition change prior to follow-up.  Discharge instructions have also been provided to the patient with some educational information regarding their diagnosis as well a list of reasons why they would want to return to the ER prior to their follow-up appointment should their condition change.  Veverly FellsAlyse N Cutter Passey, PA

## 2014-05-10 DIAGNOSIS — S99911A Unspecified injury of right ankle, initial encounter: Secondary | ICD-10-CM
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
## 2014-05-10 NOTE — ED Notes (Signed)
Pt. To x-ray via WC accompanied by rad tech.
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
## 2014-05-10 NOTE — ED Notes (Signed)
Velco ankle splint applied.  Pt. Verbalized understanding on splint education.  Pt. Demonstrated proper use of crutches.  Pt. Wheeled out of department with RN.    Pt discharged home. Pt acting age appropriately, respirations regular and unlabored, cap refill less than two seconds. Skin pink, dry and warm. Lungs clear bilaterally. No further complaints at this time. Parent/guardian verbalized understanding of discharge paperwork and has no further questions at this time.    Education provided about continuation of care, follow up care and medication administration. Patient able to provided teach back about discharge instructions.

## 2014-05-11 ENCOUNTER — Inpatient Hospital Stay
Admit: 2014-05-11 | Discharge: 2014-05-11 | Disposition: A | Discharge status: Home / Self Care | Payer: PRIVATE HEALTH INSURANCE | Attending: Pediatric Emergency Medicine

## 2014-05-11 MED ORDER — IBUPROFEN 600 MG TAB
600 mg | ORAL_TABLET | Freq: Four times a day (QID) | ORAL | Status: AC | PRN
Start: 2014-05-11 — End: ?

## 2014-05-11 MED ORDER — OXYCODONE-ACETAMINOPHEN 5 MG-325 MG TAB
5-325 mg | ORAL | Status: AC
Start: 2014-05-11 — End: 2014-05-10
  Administered 2014-05-11: 02:00:00 via ORAL

## 2014-05-11 MED FILL — OXYCODONE-ACETAMINOPHEN 5 MG-325 MG TAB: 5-325 mg | ORAL | Qty: 1

## 2014-05-13 NOTE — ED Notes (Signed)
Pt called and stated that she is still in pain and she is suppose to return to work tomorrow.  When asked if she has followed up with the orthopedic doctor, pt states she was not given one to follow up.  Discharge instructions were reviewed, and patient was [facility].  Phone number given to patient again for follow up.

## 2014-06-09 ENCOUNTER — Other Ambulatory Visit: Payer: Self-pay | Admitting: Family Medicine

## 2014-06-09 DIAGNOSIS — R1031 Right lower quadrant pain: Secondary | ICD-10-CM

## 2014-06-09 DIAGNOSIS — R102 Pelvic and perineal pain: Secondary | ICD-10-CM

## 2014-06-11 ENCOUNTER — Ambulatory Visit
Admission: RE | Admit: 2014-06-11 | Discharge: 2014-06-11 | Disposition: A | Discharge status: Home / Self Care | Payer: 59 | Source: Ambulatory Visit | Attending: Family Medicine | Admitting: Family Medicine

## 2014-06-11 DIAGNOSIS — R1031 Right lower quadrant pain: Secondary | ICD-10-CM

## 2014-06-11 DIAGNOSIS — R102 Pelvic and perineal pain: Secondary | ICD-10-CM

## 2014-06-11 IMAGING — US US PELVIS COMPLETE
1 series · 14 of 25 positions shown · non-contrast
Comparison: None.

CLINICAL DATA: Vaginal and RIGHT lower quadrant pain for 3 months
question ovarian cyst

EXAM:
TRANSABDOMINAL ULTRASOUND OF PELVIS
TECHNIQUE: Transabdominal ultrasound examination of the pelvis was performed
including evaluation of the uterus, ovaries, adnexal regions, and
pelvic cul-de-sac. Transvaginal imaging not performed as patient has
not been sexually active.

[Series 1: us pelvis complete · 0.25mm/px · 14 of 32 slices shown]
[im 1/32]
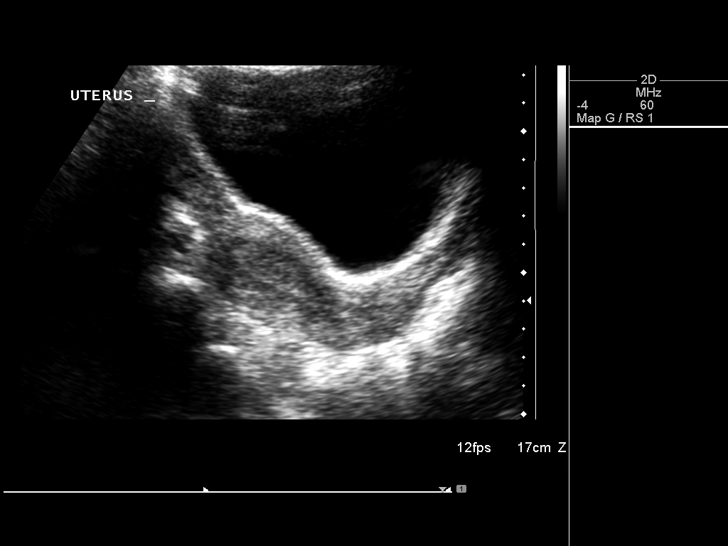
[im 3/32]
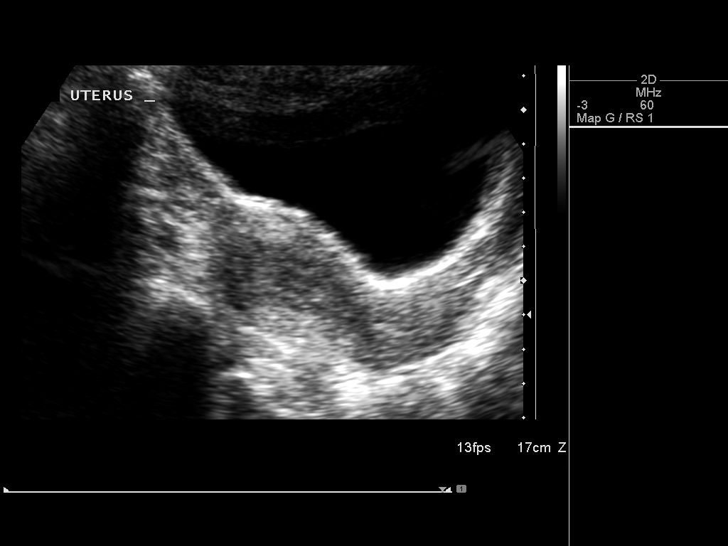
[im 6/32]
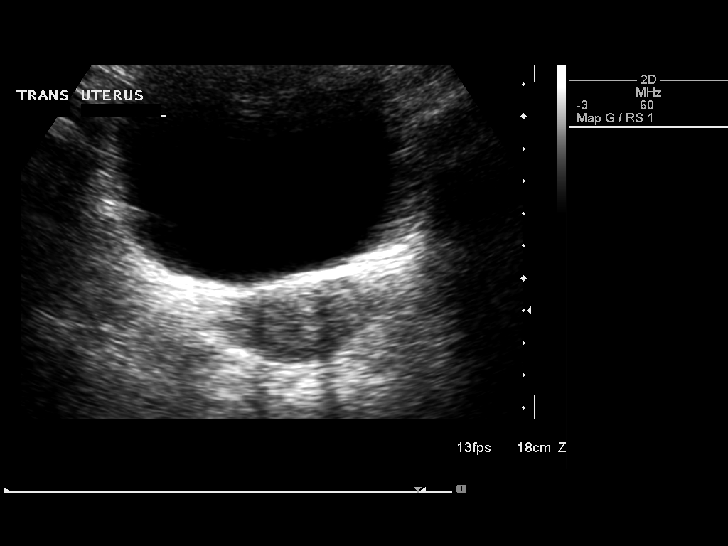
[im 8/32]
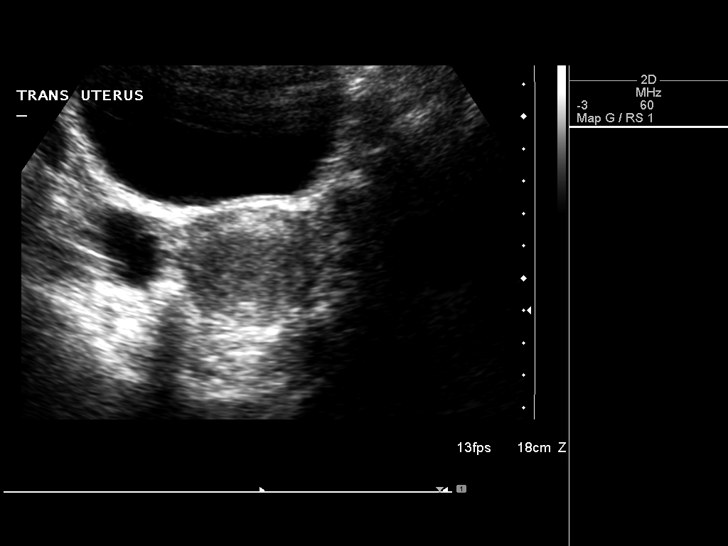
[im 11/32]
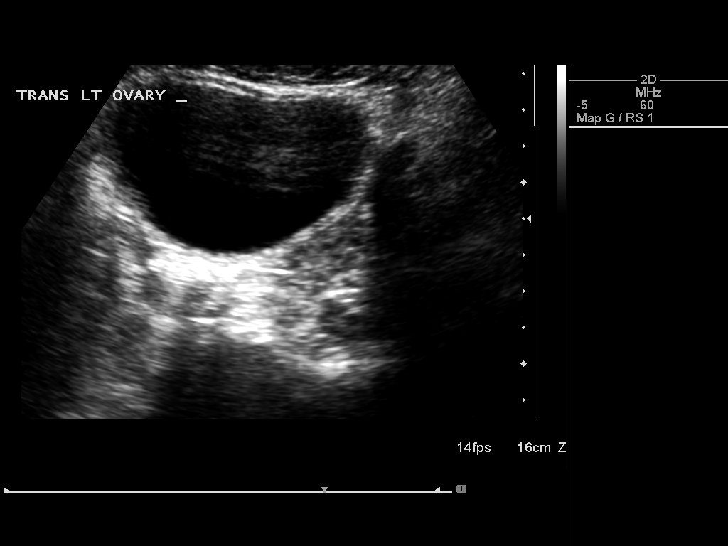
[im 12/32]
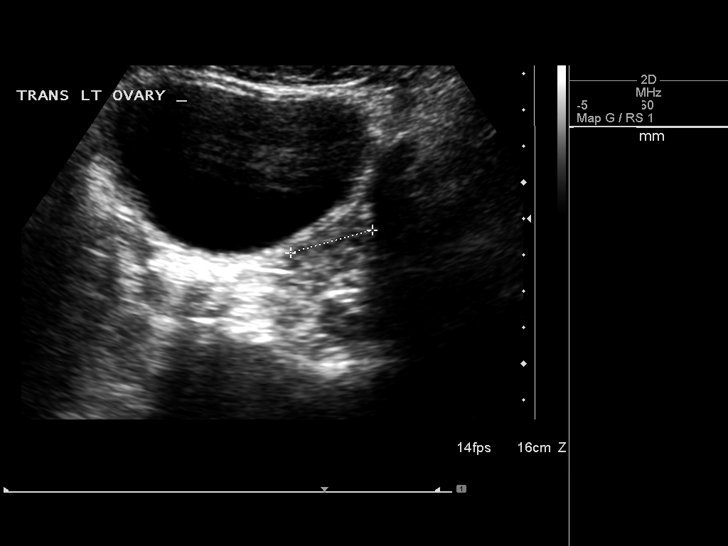
[im 15/32]
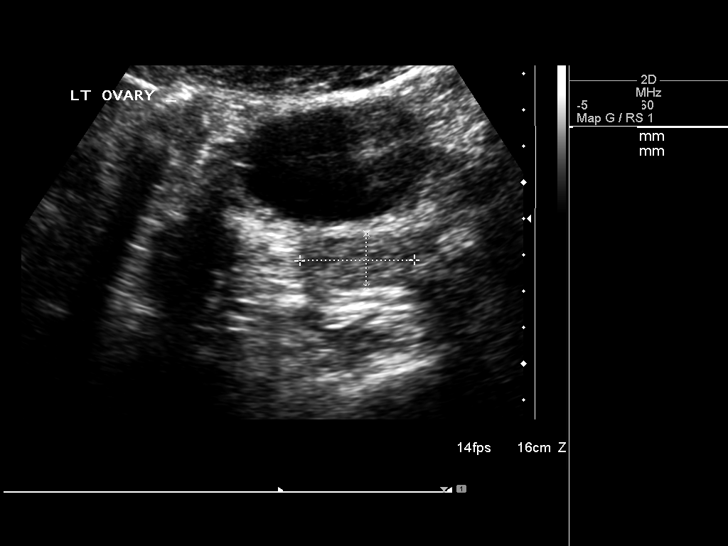
[im 17/32]
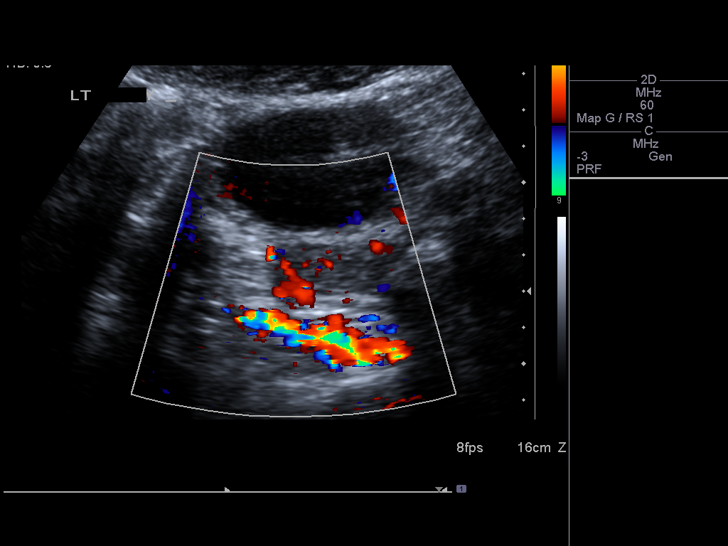
[im 20/32]
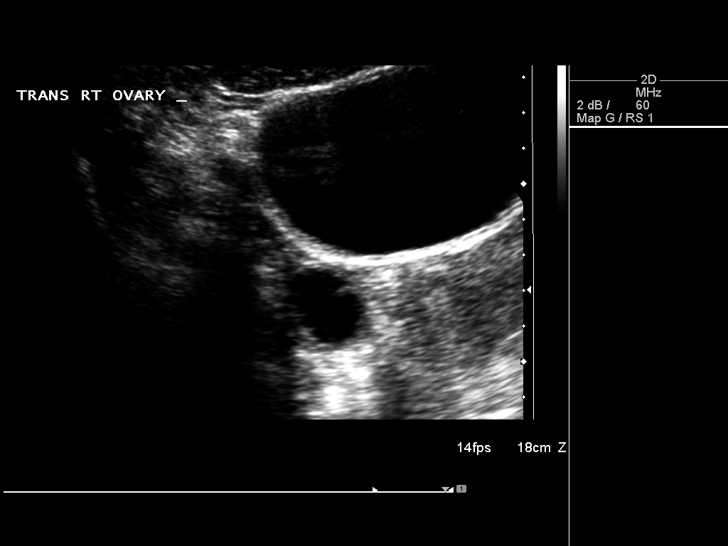
[im 21/32]
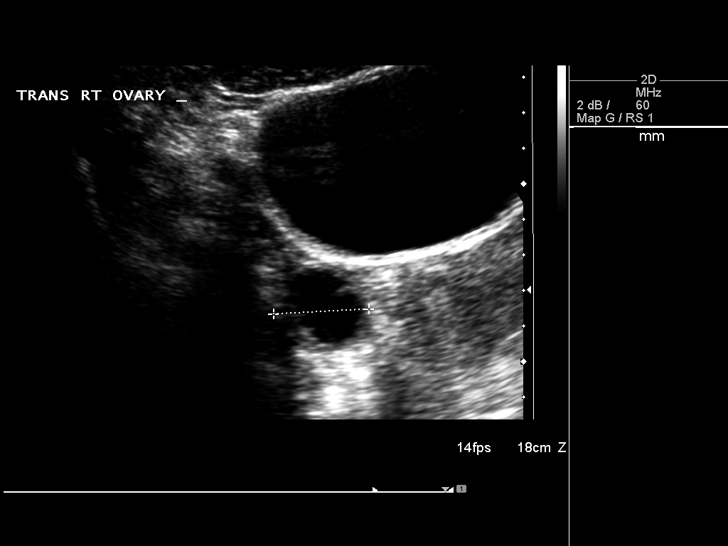
[im 24/32]
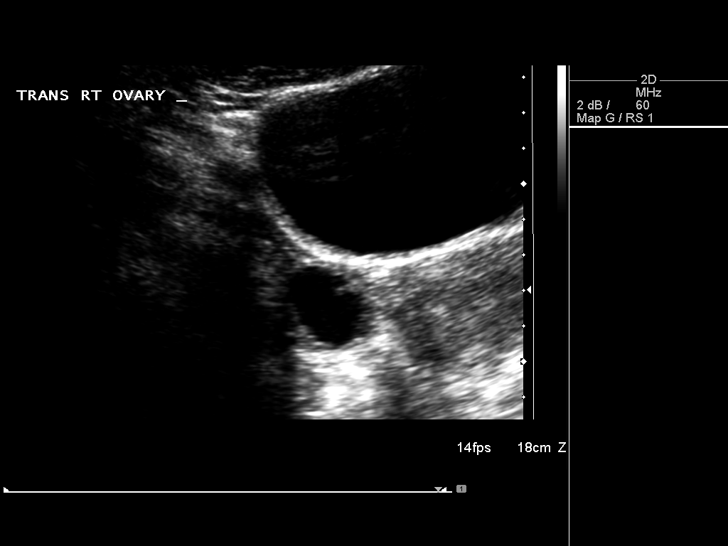
[im 26/32]
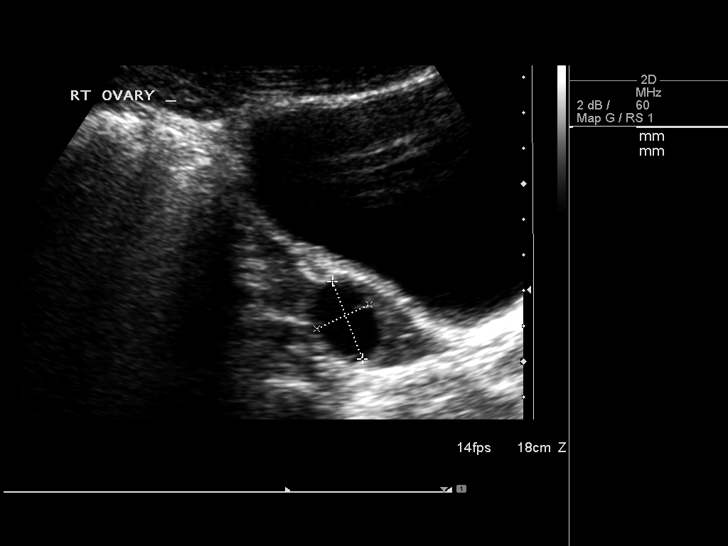
[im 29/32]
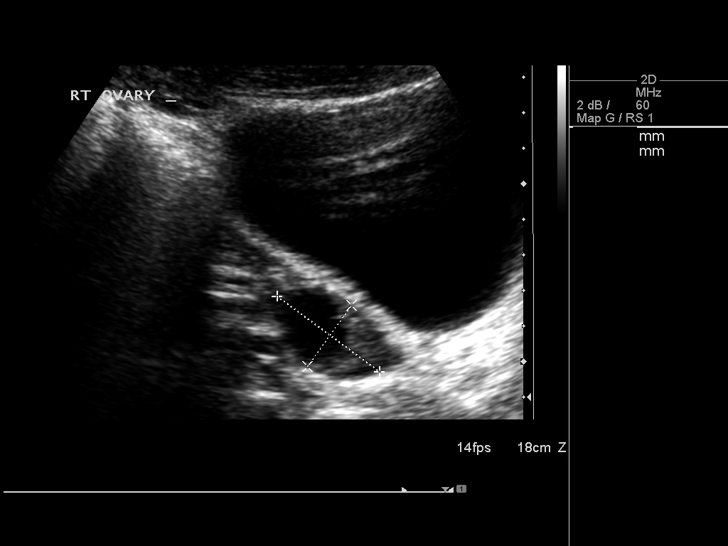
[im 32/32]
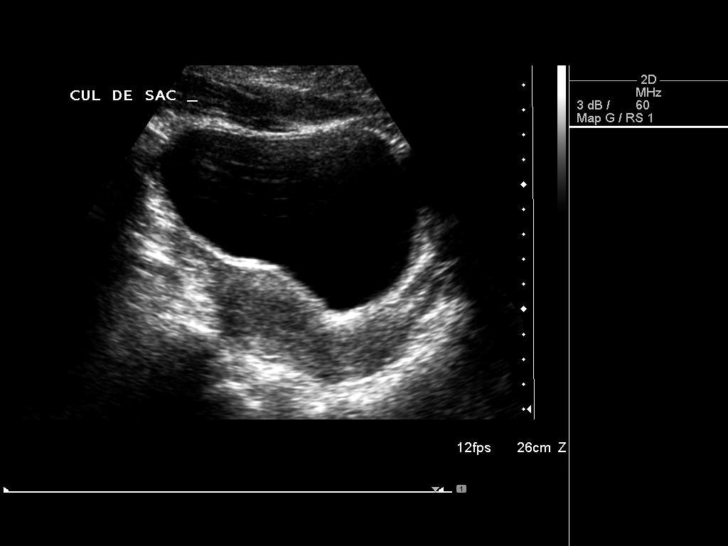

[14 of 25 positions shown; findings below may reference images not displayed]

FINDINGS: Uterus

Measurements: 7.6 x 3.2 x 4.5 cm. Normal morphology without mass.

Endometrium

Thickness: 10 mm thick, normal. No endometrial fluid or focal
abnormality

Right ovary

Measurements: 3.6 x 2.1 x 2.7 cm. Small cyst with a thin septation
within RIGHT ovary 2.3 x 1.6 x 2.1 cm

Left ovary

Measurements: 3.2 x 1.5 x 2.3 cm. Normal morphology without mass.

Other findings: No free pelvic fluid or it additional adnexal
masses.
IMPRESSION: Minimally complicated cyst RIGHT ovary 2.3 cm greatest size.

Otherwise normal study.

## 2014-08-19 ENCOUNTER — Encounter (HOSPITAL_BASED_OUTPATIENT_CLINIC_OR_DEPARTMENT_OTHER): Payer: Self-pay | Admitting: Emergency Medicine

## 2014-08-19 ENCOUNTER — Emergency Department (HOSPITAL_BASED_OUTPATIENT_CLINIC_OR_DEPARTMENT_OTHER)
Admission: EM | Admit: 2014-08-19 | Discharge: 2014-08-19 | Disposition: A | Discharge status: Home / Self Care | Payer: 59 | Attending: Emergency Medicine | Admitting: Emergency Medicine

## 2014-08-19 DIAGNOSIS — Z720 Tobacco use: Secondary | ICD-10-CM | POA: Diagnosis not present

## 2014-08-19 DIAGNOSIS — J45901 Unspecified asthma with (acute) exacerbation: Secondary | ICD-10-CM | POA: Diagnosis not present

## 2014-08-19 DIAGNOSIS — Z79899 Other long term (current) drug therapy: Secondary | ICD-10-CM | POA: Insufficient documentation

## 2014-08-19 DIAGNOSIS — J45909 Unspecified asthma, uncomplicated: Secondary | ICD-10-CM | POA: Diagnosis present

## 2014-08-19 HISTORY — DX: Unspecified asthma, uncomplicated: J45.909

## 2014-08-19 MED ORDER — ALBUTEROL SULFATE (5 MG/ML) 0.5% IN NEBU: 2.5000 mg | INHALATION_SOLUTION | Freq: Four times a day (QID) | RESPIRATORY_TRACT | Status: DC | PRN

## 2014-08-19 MED ORDER — ALBUTEROL SULFATE (2.5 MG/3ML) 0.083% IN NEBU
5.0000 mg | INHALATION_SOLUTION | Freq: Once | RESPIRATORY_TRACT | Status: AC
  Administered 2014-08-19: 5 mg via RESPIRATORY_TRACT
  Filled 2014-08-19: qty 6

## 2014-08-19 MED ORDER — IPRATROPIUM BROMIDE 0.02 % IN SOLN
0.5000 mg | Freq: Once | RESPIRATORY_TRACT | Status: AC
  Administered 2014-08-19: 0.5 mg via RESPIRATORY_TRACT
  Filled 2014-08-19: qty 2.5

## 2014-08-19 MED ORDER — PREDNISONE 50 MG PO TABS
60.0000 mg | ORAL_TABLET | Freq: Once | ORAL | Status: AC
  Administered 2014-08-19: 60 mg via ORAL
  Filled 2014-08-19 (×2): qty 1

## 2014-08-19 MED ORDER — ALBUTEROL SULFATE HFA 108 (90 BASE) MCG/ACT IN AERS: 1.0000 | INHALATION_SPRAY | Freq: Four times a day (QID) | RESPIRATORY_TRACT | Status: AC | PRN

## 2014-08-19 NOTE — ED Notes (Signed)
Pt. Reports she feels like her asthma is acting up because she came across some mold in some clothes she came across in her job.  She lost her albuterol inhaler.

## 2014-08-19 NOTE — Discharge Instructions (Signed)
Asthma Attack Prevention Although there is no way to prevent asthma from starting, you can take steps to control the disease and reduce its symptoms. Learn about your asthma and how to control it. Take an active role to control your asthma by working with your health care provider to create and follow an asthma action plan. An asthma action plan guides you in:  Taking your medicines properly.  Avoiding things that set off your asthma or make your asthma worse (asthma triggers).  Tracking your level of asthma control.  Responding to worsening asthma.  Seeking emergency care when needed. To track your asthma, keep records of your symptoms, check your peak flow number using a handheld device that shows how well air moves out of your lungs (peak flow meter), and get regular asthma checkups.  WHAT ARE SOME WAYS TO PREVENT AN ASTHMA ATTACK?  Take medicines as directed by your health care provider.  Keep track of your asthma symptoms and level of control.  With your health care provider, write a detailed plan for taking medicines and managing an asthma attack. Then be sure to follow your action plan. Asthma is an ongoing condition that needs regular monitoring and treatment.  Identify and avoid asthma triggers. Many outdoor allergens and irritants (such as pollen, mold, cold air, and air pollution) can trigger asthma attacks. Find out what your asthma triggers are and take steps to avoid them.  Monitor your breathing. Learn to recognize warning signs of an attack, such as coughing, wheezing, or shortness of breath. Your lung function may decrease before you notice any signs or symptoms, so regularly measure and record your peak airflow with a home peak flow meter.  Identify and treat attacks early. If you act quickly, you are less likely to have a severe attack. You will also need less medicine to control your symptoms. When your peak flow measurements decrease and alert you to an upcoming attack,  take your medicine as instructed and immediately stop any activity that may have triggered the attack. If your symptoms do not improve, get medical help.  Pay attention to increasing quick-relief inhaler use. If you find yourself relying on your quick-relief inhaler, your asthma is not under control. See your health care provider about adjusting your treatment. WHAT CAN MAKE MY SYMPTOMS WORSE? A number of common things can set off or make your asthma symptoms worse and cause temporary increased inflammation of your airways. Keep track of your asthma symptoms for several weeks, detailing all the environmental and emotional factors that are linked with your asthma. When you have an asthma attack, go back to your asthma diary to see which factor, or combination of factors, might have contributed to it. Once you know what these factors are, you can take steps to control many of them. If you have allergies and asthma, it is important to take asthma prevention steps at home. Minimizing contact with the substance to which you are allergic will help prevent an asthma attack. Some triggers and ways to avoid these triggers are: Animal Dander:  Some people are allergic to the flakes of skin or dried saliva from animals with fur or feathers.   There is no such thing as a hypoallergenic dog or cat breed. All dogs or cats can cause allergies, even if they don't shed.  Keep these pets out of your home.  If you are not able to keep a pet outdoors, keep the pet out of your bedroom and other sleeping areas at all  times, and keep the door closed.  Remove carpets and furniture covered with cloth from your home. If that is not possible, keep the pet away from fabric-covered furniture and carpets. Dust Mites: Many people with asthma are allergic to dust mites. Dust mites are tiny bugs that are found in every home in mattresses, pillows, carpets, fabric-covered furniture, bedcovers, clothes, stuffed toys, and other  fabric-covered items.   Cover your mattress in a special dust-proof cover.  Cover your pillow in a special dust-proof cover, or wash the pillow each week in hot water. Water must be hotter than 130 F (54.4 C) to kill dust mites. Cold or warm water used with detergent and bleach can also be effective.  Wash the sheets and blankets on your bed each week in hot water.  Try not to sleep or lie on cloth-covered cushions.  Call ahead when traveling and ask for a smoke-free hotel room. Bring your own bedding and pillows in case the hotel only supplies feather pillows and down comforters, which may contain dust mites and cause asthma symptoms.  Remove carpets from your bedroom and those laid on concrete, if you can.  Keep stuffed toys out of the bed, or wash the toys weekly in hot water or cooler water with detergent and bleach. Cockroaches: Many people with asthma are allergic to the droppings and remains of cockroaches.   Keep food and garbage in closed containers. Never leave food out.  Use poison baits, traps, powders, gels, or paste (for example, boric acid).  If a spray is used to kill cockroaches, stay out of the room until the odor goes away. Indoor Mold:  Fix leaky faucets, pipes, or other sources of water that have mold around them.  Clean floors and moldy surfaces with a fungicide or diluted bleach.  Avoid using humidifiers, vaporizers, or swamp coolers. These can spread molds through the air. Pollen and Outdoor Mold:  When pollen or mold spore counts are high, try to keep your windows closed.  Stay indoors with windows closed from late morning to afternoon. Pollen and some mold spore counts are highest at that time.  Ask your health care provider whether you need to take anti-inflammatory medicine or increase your dose of the medicine before your allergy season starts. Other Irritants to Avoid:  Tobacco smoke is an irritant. If you smoke, ask your health care provider how  you can quit. Ask family members to quit smoking, too. Do not allow smoking in your home or car.  If possible, do not use a wood-burning stove, kerosene heater, or fireplace. Minimize exposure to all sources of smoke, including incense, candles, fires, and fireworks.  Try to stay away from strong odors and sprays, such as perfume, talcum powder, hair spray, and paints.  Decrease humidity in your home and use an indoor air cleaning device. Reduce indoor humidity to below 60%. Dehumidifiers or central air conditioners can do this.  Decrease house dust exposure by changing furnace and air cooler filters frequently.  Try to have someone else vacuum for you once or twice a week. Stay out of rooms while they are being vacuumed and for a short while afterward.  If you vacuum, use a dust mask from a hardware store, a double-layered or microfilter vacuum cleaner bag, or a vacuum cleaner with a HEPA filter.  Sulfites in foods and beverages can be irritants. Do not drink beer or wine or eat dried fruit, processed potatoes, or shrimp if they cause asthma symptoms.  Cold  air can trigger an asthma attack. Cover your nose and mouth with a scarf on cold or windy days.  Several health conditions can make asthma more difficult to manage, including a runny nose, sinus infections, reflux disease, psychological stress, and sleep apnea. Work with your health care provider to manage these conditions.  Avoid close contact with people who have a respiratory infection such as a cold or the flu, since your asthma symptoms may get worse if you catch the infection. Wash your hands thoroughly after touching items that may have been handled by people with a respiratory infection.  Get a flu shot every year to protect against the flu virus, which often makes asthma worse for days or weeks. Also get a pneumonia shot if you have not previously had one. Unlike the flu shot, the pneumonia shot does not need to be given  yearly. Medicines:  Talk to your health care provider about whether it is safe for you to take aspirin or non-steroidal anti-inflammatory medicines (NSAIDs). In a small number of people with asthma, aspirin and NSAIDs can cause asthma attacks. These medicines must be avoided by people who have known aspirin-sensitive asthma. It is important that people with aspirin-sensitive asthma read labels of all over-the-counter medicines used to treat pain, colds, coughs, and fever.  Beta-blockers and ACE inhibitors are other medicines you should discuss with your health care provider. HOW CAN I FIND OUT WHAT I AM ALLERGIC TO? Ask your asthma health care provider about allergy skin testing or blood testing (the RAST test) to identify the allergens to which you are sensitive. If you are found to have allergies, the most important thing to do is to try to avoid exposure to any allergens that you are sensitive to as much as possible. Other treatments for allergies, such as medicines and allergy shots (immunotherapy) are available.  CAN I EXERCISE? Follow your health care provider's advice regarding asthma treatment before exercising. It is important to maintain a regular exercise program, but vigorous exercise or exercise in cold, humid, or dry environments can cause asthma attacks, especially for those people who have exercise-induced asthma. Document Released: 03/21/2009 Document Revised: 04/07/2013 Document Reviewed: 10/08/2012 [facility] Patient Information 2015 [place], [place]. This information is not intended to replace advice given to you by your health care provider. Make sure you discuss any questions you have with your health care provider. Asthma Asthma is a recurring condition in which the airways tighten and narrow. Asthma can make it difficult to breathe. It can cause coughing, wheezing, and shortness of breath. Asthma episodes, also called asthma attacks, range from minor to life-threatening. Asthma  cannot be cured, but medicines and lifestyle changes can help control it. CAUSES Asthma is believed to be caused by inherited (genetic) and environmental factors, but its exact cause is unknown. Asthma may be triggered by allergens, lung infections, or irritants in the air. Asthma triggers are different for each person. Common triggers include:   Animal dander.  Dust mites.  Cockroaches.  Pollen from trees or grass.  Mold.  Smoke.  Air pollutants such as dust, household cleaners, hair sprays, aerosol sprays, paint fumes, strong chemicals, or strong odors.  Cold air, weather changes, and winds (which increase molds and pollens in the air).  Strong emotional expressions such as crying or laughing hard.  Stress.  Certain medicines (such as aspirin) or types of drugs (such as beta-blockers).  Sulfites in foods and drinks. Foods and drinks that may contain sulfites include dried fruit, potato chips,  and sparkling grape juice.  Infections or inflammatory conditions such as the flu, a cold, or an inflammation of the nasal membranes (rhinitis).  Gastroesophageal reflux disease (GERD).  Exercise or strenuous activity. SYMPTOMS Symptoms may occur immediately after asthma is triggered or many hours later. Symptoms include:  Wheezing.  Excessive nighttime or early morning coughing.  Frequent or severe coughing with a common cold.  Chest tightness.  Shortness of breath. DIAGNOSIS  The diagnosis of asthma is made by a review of your medical history and a physical exam. Tests may also be performed. These may include:  Lung function studies. These tests show how much air you breathe in and out.  Allergy tests.  Imaging tests such as X-rays. TREATMENT  Asthma cannot be cured, but it can usually be controlled. Treatment involves identifying and avoiding your asthma triggers. It also involves medicines. There are 2 classes of medicine used for asthma treatment:   Controller  medicines. These prevent asthma symptoms from occurring. They are usually taken every day.  Reliever or rescue medicines. These quickly relieve asthma symptoms. They are used as needed and provide short-term relief. Your health care provider will help you create an asthma action plan. An asthma action plan is a written plan for managing and treating your asthma attacks. It includes a list of your asthma triggers and how they may be avoided. It also includes information on when medicines should be taken and when their dosage should be changed. An action plan may also involve the use of a device called a peak flow meter. A peak flow meter measures how well the lungs are working. It helps you monitor your condition. HOME CARE INSTRUCTIONS   Take medicines only as directed by your health care provider. Speak with your health care provider if you have questions about how or when to take the medicines.  Use a peak flow meter as directed by your health care provider. Record and keep track of readings.  Understand and use the action plan to help minimize or stop an asthma attack without needing to seek medical care.  Control your home environment in the following ways to help prevent asthma attacks:  Do not smoke. Avoid being exposed to secondhand smoke.  Change your heating and air conditioning filter regularly.  Limit your use of fireplaces and wood stoves.  Get rid of pests (such as roaches and mice) and their droppings.  Throw away plants if you see mold on them.  Clean your floors and dust regularly. Use unscented cleaning products.  Try to have someone else vacuum for you regularly. Stay out of rooms while they are being vacuumed and for a short while afterward. If you vacuum, use a dust mask from a hardware store, a double-layered or microfilter vacuum cleaner bag, or a vacuum cleaner with a HEPA filter.  Replace carpet with wood, tile, or vinyl flooring. Carpet can trap dander and  dust.  Use allergy-proof pillows, mattress covers, and box spring covers.  Wash bed sheets and blankets every week in hot water and dry them in a dryer.  Use blankets that are made of polyester or cotton.  Clean bathrooms and kitchens with bleach. If possible, have someone repaint the walls in these rooms with mold-resistant paint. Keep out of the rooms that are being cleaned and painted.  Wash hands frequently. SEEK MEDICAL CARE IF:   You have wheezing, shortness of breath, or a cough even if taking medicine to prevent attacks.  The colored mucus you  cough up (sputum) is thicker than usual.  Your sputum changes from clear or white to yellow, green, gray, or bloody.  You have any problems that may be related to the medicines you are taking (such as a rash, itching, swelling, or trouble breathing).  You are using a reliever medicine more than 2-3 times per week.  Your peak flow is still at 50-79% of your personal best after following your action plan for 1 hour.  You have a fever. SEEK IMMEDIATE MEDICAL CARE IF:   You seem to be getting worse and are unresponsive to treatment during an asthma attack.  You are short of breath even at rest.  You get short of breath when doing very little physical activity.  You have difficulty eating, drinking, or talking due to asthma symptoms.  You develop chest pain.  You develop a fast heartbeat.  You have a bluish color to your lips or fingernails.  You are light-headed, dizzy, or faint.  Your peak flow is less than 50% of your personal best. MAKE SURE YOU:   Understand these instructions.  Will watch your condition.  Will get help right away if you are not doing well or get worse. Document Released: 04/02/2005 Document Revised: 08/17/2013 Document Reviewed: 10/30/2012 [facility] Patient Information 2015 [place], [place]. This information is not intended to replace advice given to you by your health care provider. Make sure you  discuss any questions you have with your health care provider.   RESOURCE GUIDE  Chronic Pain Problems: Contact [facility]  [phone] Patients need to be referred by their primary care doctor.  Insufficient Money for Medicine: Contact [employer]:  call "211" or [place] [phone].  No Primary Care Doctor: Call [facility]  [phone] - can help you locate a primary care doctor that  accepts your insurance, provides certain services, etc. Physician Referral Service- [phone]  Agencies that provide inexpensive medical care: [facility]  [facility]  [phone] [facility]  [phone] [facility]  [phone] [facility]  [phone] [facility]  [phone]  Friendly Providers: [facility]- [address]  [phone], Mon-Fri 9am-7pm, Sat 9am-1pm [place], [address] [place]  [place], [address] [place]  [place]- [address]  [place], [address], [phone]  Only accepts [place] patients after they have their name  applied to their card  Self Pay (no insurance) in [facility]: Sickle Cell Patients: Dr Kevan Ny, [facility]  [place], [facility]- [place]  [facility] [place]- [address], [facility]- see information above (Speak to [employer] if you do not have insurance)       -  [facility]- [place], [place],  [place] [address], [phone]       -  Dr Vista Lawman-  [address], [place], [place],  [phone]       -  [facility]- [address], [phone]       -  [facility] [place]- [address], [place], also [address] [phone]       -    [facility]- [address], [place], 1st & 3rd Saturday   every month, 10am-1pm  1) Find a Doctor and Pay Out of Pocket Although you won't have to find out who is covered by your insurance plan, it is a good idea to ask around and get recommendations. You will then need to call the office and see if the doctor you have chosen will accept you as a new patient and what types of options they offer for patients who are self-pay. Some doctors offer discounts or will set up payment plans for their patients who do not have insurance, but you will need to ask so you aren't surprised when you get to your appointment.  2) Contact Your Local Health Department Not all health departments have doctors that can see patients for sick visits, but many do, so it is worth a call to see if yours does. If you don't know where your local health department is, you can check in your phone book. The CDC also has a tool to help you locate your state's health department, and many state websites also have listings of all of their local health departments.  3) Find a [place] [facility] If your illness is not likely to be very severe or complicated, you may want to try a walk in clinic. These are popping up all over the country in pharmacies, drugstores, and shopping centers. They're usually staffed by nurse practitioners or physician assistants that have been trained to treat common illnesses and complaints. They're usually fairly quick and inexpensive. However, if you have serious medical issues or chronic medical problems, these are probably not your best option  [facility] [place], [facility], [address] [place], phone [phone] or [phone].  Monday - Friday, call for an  appointment. [place], [facility], [address], [place], phone [phone] or [phone].  Monday - Friday, call for an appointment.  Abuse/Neglect: [place] [phone] [place] [phone] (After Hours)  Emergency Shelter:  [facility] [phone]  Maternity Homes: Room at the [place] [phone] [place] [phone]  MRSA Hotline #:   (518)861-9956  [facility] Dept. [address]         [place] Phone:  [phone]                                  Phone:  [phone]                   Phone:  [place], [place]- [phone]       -     [facility] in [place], [address],                                  [place] [phone] or [phone] (After Hours)   [place]  Substance Abuse Resources: Alcohol and Drug Services  [phone] Center [phone] [facility] [place] Chinita Pester [phone] Residential & Outpatient Substance Abuse Program  [phone]  Psychological Services: [place]  [phone] [place]  [place], [place]. [address] [place], [place]: [phone] or [phone], [employer]  Dental Assistance  If unable to pay or uninsured, contact:  [facility] or [facility]. to become qualified for the adult dental  clinic.  Patients with Medicaid: [facility] [phone] [address] [place], [place] [address] [phone]  If unable to pay, or uninsured, contact [facility] [phone]) or [place] [phone] in [place], [place] in [facility]) to become qualified for the adult dental clinic  Other [place]- [place], [place], [place], [address], [place], [place], 2nd and 4th Thursday of the month at 6:30am.  10 clients each day by appointment, can sometimes see walk-in patients if someone does not show for an appointment. [facility]- [address] Hillard Danker [place], [place], [address], [place], [place], [place], [address], [place] [facility] [phone]  Please make every effort to establish with a primary care physician for routine medical care  [place]  The [place] provides a wide range of adult health services. Some of these services are designed to address the healthcare needs of all [facility] residents and all services are designed to meet the needs of uninsured/underinsured low income residents. Some services are available to any resident of [place], call [phone] for details. ] The [facility], a new medical clinic for adults, is now open. For more information about the Center and its services please call [phone]. For information on our [place] services, click here.  For more information on any of the following Department of Public Health programs, including hours of service, click on the highlighted link.  SERVICES FOR WOMEN (Adults and Teens) [employer] provide a full range of birth control options plus education and  counseling. New patient visit and annual return visits include a complete examination, pap test as indicated, and other laboratory as indicated. Included is our [employer] for men.  Maternity Care is provided through pregnancy, including a six week post partum exam. Women who meet eligibility criteria for the Medicaid for Pregnant Women program, receive care free. Other women are charged on a sliding scale according to income. Note: [facility] provides services to pregnant women who have a Medicaid card. Call [phone] for an appointment in [place] or [phone] for an appointment in [facility].  Primary Care for [facility] Access Women is available through the  [place]. As primary care provider for the [place] program, women may designate the [facility] clinic as their primary care provider.  PLEASE CALL [phone] FOR AN APPOINTMENT FOR THE ABOVE SERVICES IN EITHER [place] OR [place]. Information available in [place] and [place].   Childbirth Education Classes are open to the public and offered to help families prepare for the best possible childbirth experience as well as to promote lifelong health and wellness. Classes are offered throughout the year and meet on the same night once a week for five weeks. Medicaid covers the cost of the classes for the mother-to-be and her partner. For participants without Medicaid, the cost of the class series is $45.00 for the mother-to-be and her partner. Class size is limited and registration is required. For more information or to register call [phone]. Baby items donated by [employer] and the [employer] [place] are given away during each class series.  SERVICES FOR WOMEN AND MEN Sexually Transmitted Infection appointments, including HIV testing, are available daily (weekdays, except holidays). Call early as same-day  appointments are limited. For an appointment in either [facility] or [place], call [phone]. Services are confidential and free of charge.  Skin Testing for Tuberculosis Please call [phone]. Adult Immunizations are available, usually for a fee. Please call [phone] for details.  PLEASE CALL [phone] FOR AN APPOINTMENT FOR THE ABOVE SERVICES IN EITHER [place] OR [place].   [facility] provides up to the minute recommended vaccines for your travel destination. We also provide essential health and political information to help insure a safe and pleasurable travel experience. This program is self-sustaining, however, fees are very competitive. We are a CERTIFIED YELLOW FEVER IMMUNIZATION approved clinic site. PLEASE CALL [phone] FOR AN APPOINTMENT IN EITHER [place] OR [place].   If you have questions about the services listed above, we want to answer them! Email [place] at: [email or web address] Home Visiting Services for elderly and the disabled are available to residents of [facility] who are in need of care that compares to the care offered by a nursing home, have needs that can be met by the program, and have [place] Medicaid. Other short term services are available to residents 18 years and older who are unable to meet requirements for eligibility to receive services from a certified home health agency, spend the majority of time at home, and need care for six months or less.  PLEASE CALL [phone] OR [phone] FOR MORE INFORMATION. Medication Assistance Program serves as a link between pharmaceutical companies and patients to provide low cost or free prescription medications. This servce is available for residents who meet certain income restrictions and have no insurance coverage.  PLEASE CALL [phone] () OR [phone] ([place]) FOR MORE INFORMATION.  Updated Feb. 21, 2013

## 2014-08-19 NOTE — ED Notes (Signed)
Pt in with c/o of asthma, is out of inhaler and nebulizer solution states breathing feels tight with no PCP who can refill them. Lungs clear on assessment, dry cough noted.

## 2014-08-19 NOTE — ED Notes (Signed)
[facility] care assumed

## 2014-08-19 NOTE — ED Provider Notes (Signed)
CSN: [id number]     Arrival date & time 08/19/14  1414 History   First MD Initiated Contact with Patient 08/19/14 1446     Chief Complaint  Patient presents with  . Asthma     (Consider location/radiation/quality/duration/timing/severity/associated sxs/prior Treatment) HPI    PCP: Samantha C, MD Blood pressure 123/68, pulse 85, temperature 98.3 F (36.8 Allen), temperature source Oral, resp. rate 18, height 5\' 3"  (1.6 m), weight 270 lb (122.471 kg), last menstrual period 08/09/2014, SpO2 100 %.  Samantha Allen is a 19 y.o.female with a significant PMH of  Cholecystectomy, tonsillectomy , asthma presents to the ER with complaints of  Asthma exacerbation over the past few days. She reports working with Samantha Allen and having come across some clothes with mold on it.  She was wheezing and coughing for two days but it was resolving with her   Albuterol inhaler. Does not have any more albuterol nebulizer solution or an HFA and is requesting refills. She also would like a Facilities managerresource guide.   Negative Review of Symptoms: fevers, N/V/D, cough, CP, SOB, lower extremity swelling, confusion, headache.   Past Medical History  Diagnosis Date  . Pain from implanted hardware 11/2011    retained syndesmosis screw right ankle  . Asthma    Past Surgical History  Procedure Laterality Date  . Cholecystectomy    . Tonsillectomy    . Orif ankle fracture  07/2011    right  . Hardware removal  11/22/2011    Procedure: HARDWARE REMOVAL;  Surgeon: Samantha ArthursJohn Hewitt, MD;  Location: [facility];  Service: Orthopedics;  Laterality: Right;   Family History  Problem Relation Age of Onset  . Anesthesia problems Mother     post-op N/V   History  Substance Use Topics  . Smoking status: Current Some Day Smoker  . Smokeless tobacco: Never Used  . Alcohol Use: No   OB History    No data available     Review of Systems  10 Systems reviewed and are negative for acute change except as noted in the HPI.    Allergies  Review of patient's allergies indicates no known allergies.  Home Medications   Prior to Admission medications   Medication Sig Start Date End Date Taking? Authorizing Provider  albuterol (PROVENTIL HFA;VENTOLIN HFA) 108 (90 BASE) MCG/ACT inhaler Inhale 1-2 puffs into the lungs every 6 (six) hours as needed for wheezing or shortness of breath. 08/19/14   Samantha Peliffany Sydne Krahl, PA-Allen  albuterol (PROVENTIL) (5 MG/ML) 0.5% nebulizer solution Take 0.5 mLs (2.5 mg total) by nebulization every 6 (six) hours as needed for wheezing or shortness of breath. 08/19/14   Samantha Mcvicker Neva SeatGreene, PA-Allen  ibuprofen (ADVIL,MOTRIN) 200 MG tablet Take 200 mg by mouth every 6 (six) hours as needed. Pain/ headache    Historical Provider, MD   BP 123/68 mmHg  Pulse 85  Temp(Src) 98.3 F (36.8 Allen) (Oral)  Resp 18  Ht 5\' 3"  (1.6 m)  Wt 270 lb (122.471 kg)  BMI 47.84 kg/m2  SpO2 100%  LMP 08/09/2014 Physical Exam  Constitutional: She appears well-developed and well-nourished. No distress.  HENT:  Head: Normocephalic and atraumatic.  Eyes: Pupils are equal, round, and reactive to light.  Neck: Normal range of motion. Neck supple.  Cardiovascular: Normal rate and regular rhythm.   Pulmonary/Chest: Effort normal. She has no decreased breath sounds. She has wheezes (very mild expiratory wheezing). She has no rhonchi. She has no rales.  Abdominal: Soft.  Neurological: She is alert.  Skin: Skin is warm and dry.  Nursing note and vitals reviewed.   ED Course  Procedures (including critical care time) Labs Review Labs Reviewed - No data to display  Imaging Review No results found.   EKG Interpretation None      MDM   Final diagnoses:  Asthma exacerbation    Medications  albuterol (PROVENTIL) (2.5 MG/3ML) 0.083% nebulizer solution 5 mg (not administered)  ipratropium (ATROVENT) nebulizer solution 0.5 mg (not administered)  predniSONE (DELTASONE) tablet 60 mg (60 mg Oral Given 08/19/14 1507)     albuterol nebulizer treatments given in the emergency department. I've refilled her medications. She has been given a Facilities managerresource guide and [facility]. She is well appearing, non toxic, no resp distress and very mild wheezing.  18 y.o.Samantha Allen's evaluation in the Emergency Department is complete. It has been determined that no acute conditions requiring further emergency intervention are present at this time. The patient/guardian have been advised of the diagnosis and plan. We have discussed signs and symptoms that warrant return to the ED, such as changes or worsening in symptoms.  Vital signs are stable at discharge. Filed Vitals:   08/19/14 1421  BP: 123/68  Pulse: 85  Temp: 98.3 F (36.8 Allen)  Resp: 18    Patient/guardian has voiced understanding and agreed to follow-up with the PCP or specialist.     Samantha Peliffany Samantha Hauge, PA-Allen 08/19/14 1511  Samantha Creasehristopher J Pollina, MD 08/21/14 [phone]

## 2014-08-19 NOTE — ED Notes (Signed)
RRT at bedside

## 2014-08-20 ENCOUNTER — Encounter (HOSPITAL_COMMUNITY): Payer: Self-pay | Admitting: Emergency Medicine

## 2014-08-20 ENCOUNTER — Emergency Department (HOSPITAL_COMMUNITY): Payer: 59

## 2014-08-20 ENCOUNTER — Emergency Department (HOSPITAL_COMMUNITY)
Admission: EM | Admit: 2014-08-20 | Discharge: 2014-08-20 | Disposition: A | Discharge status: Home / Self Care | Payer: 59 | Attending: Emergency Medicine | Admitting: Emergency Medicine

## 2014-08-20 DIAGNOSIS — R000 Tachycardia, unspecified: Secondary | ICD-10-CM | POA: Diagnosis not present

## 2014-08-20 DIAGNOSIS — Z79899 Other long term (current) drug therapy: Secondary | ICD-10-CM | POA: Insufficient documentation

## 2014-08-20 DIAGNOSIS — J45909 Unspecified asthma, uncomplicated: Secondary | ICD-10-CM | POA: Insufficient documentation

## 2014-08-20 DIAGNOSIS — Z720 Tobacco use: Secondary | ICD-10-CM | POA: Diagnosis not present

## 2014-08-20 DIAGNOSIS — R079 Chest pain, unspecified: Secondary | ICD-10-CM | POA: Insufficient documentation

## 2014-08-20 DIAGNOSIS — R0789 Other chest pain: Secondary | ICD-10-CM

## 2014-08-20 LAB — BASIC METABOLIC PANEL
Anion gap: 12 (ref 5–15)
BUN: 9 mg/dL (ref 6–20)
CO2: 19 mmol/L — ABNORMAL LOW (ref 22–32)
Calcium: 8.8 mg/dL — ABNORMAL LOW (ref 8.9–10.3)
Chloride: 105 mmol/L (ref 101–111)
Creatinine, Ser: 0.87 mg/dL (ref 0.44–1.00)
GFR calc Af Amer: >60 mL/min (ref >60)
GFR calc non Af Amer: >60 mL/min (ref >60)
Glucose, Bld: 243 mg/dL — ABNORMAL HIGH (ref 70–99)
Potassium: 3.2 mmol/L — ABNORMAL LOW (ref 3.5–5.1)
Sodium: 136 mmol/L (ref 135–145)

## 2014-08-20 LAB — D-DIMER, QUANTITATIVE: D-Dimer, Quant: <0.27 ug/mL-FEU (ref 0.00–0.48)

## 2014-08-20 IMAGING — CR DG CHEST 2V
2 series · 2 of 2 positions shown · non-contrast
Comparison: None.

CLINICAL DATA: Dyspnea for 48 hour

EXAM:
CHEST  2 VIEW

[w chest pa]
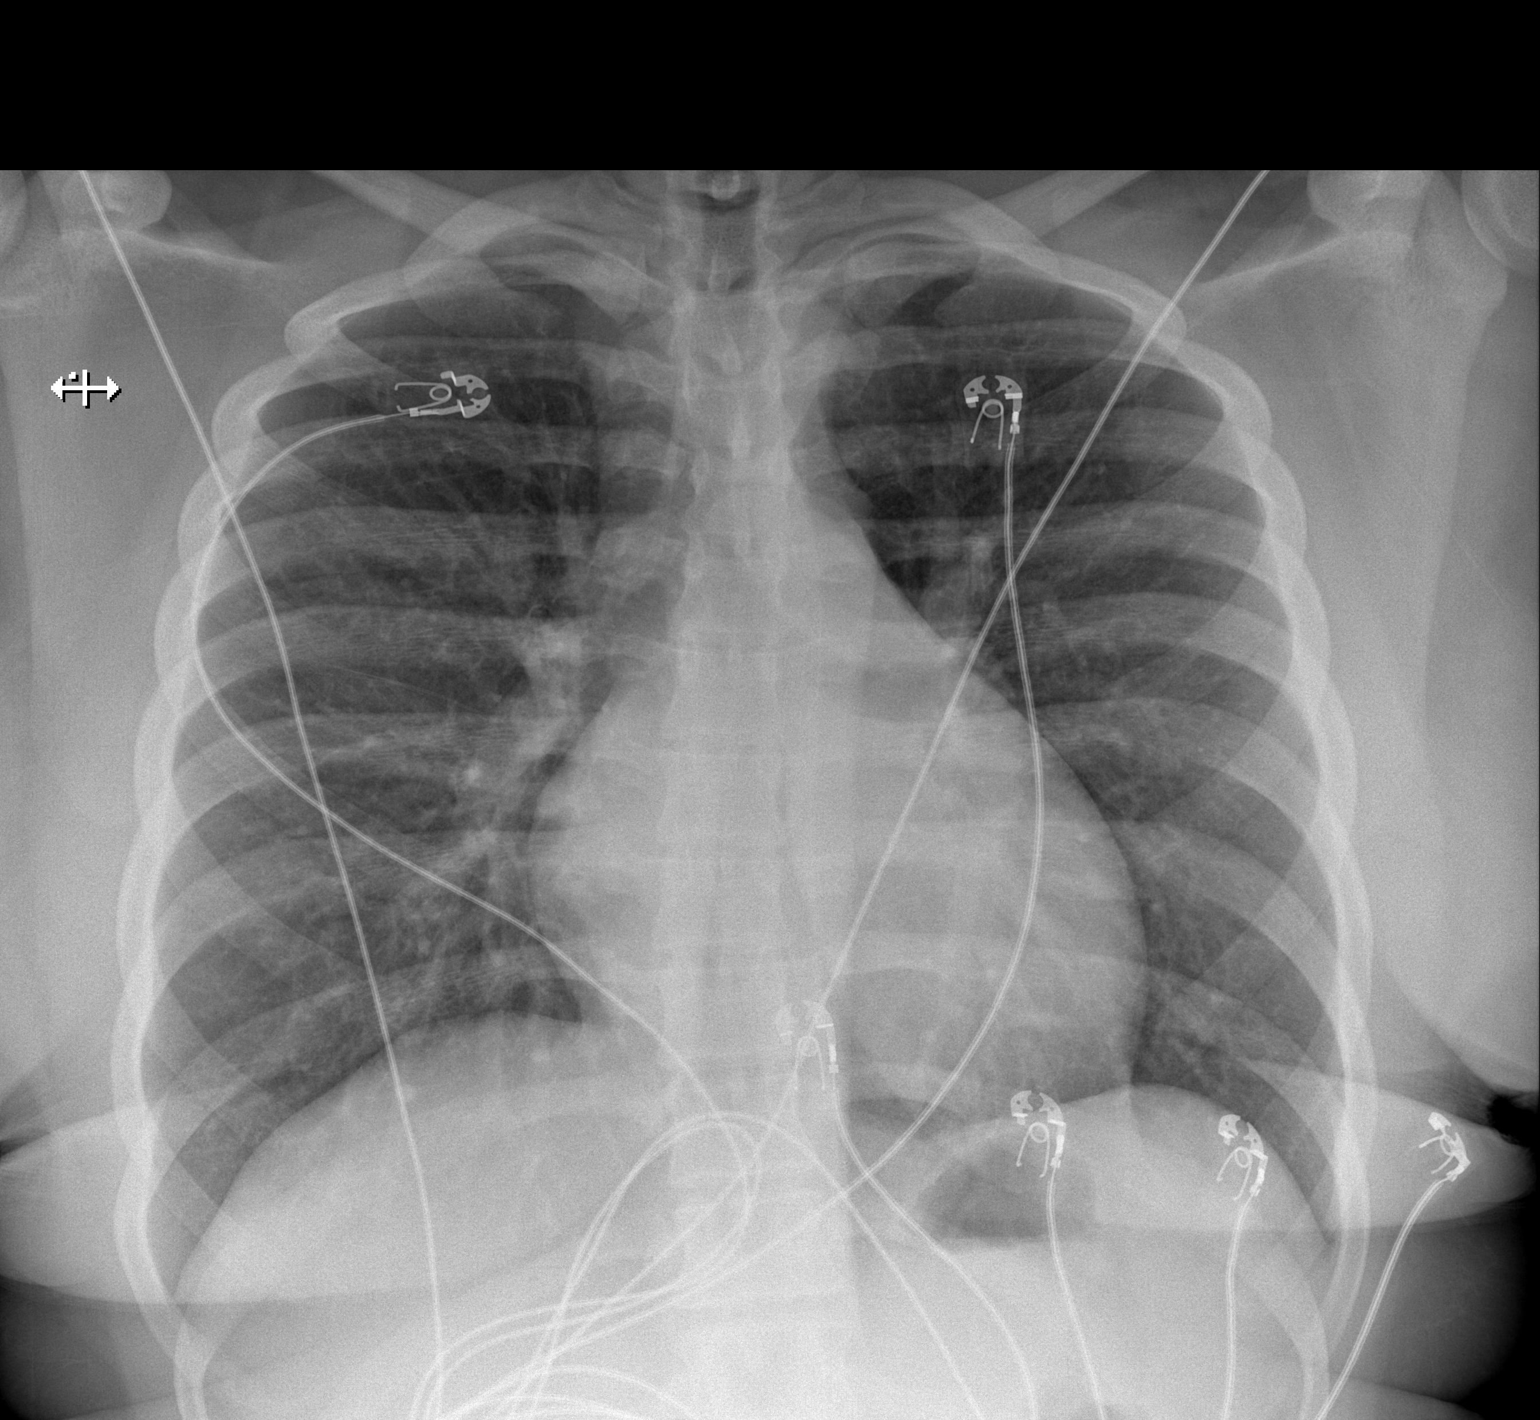

[w chest lat]
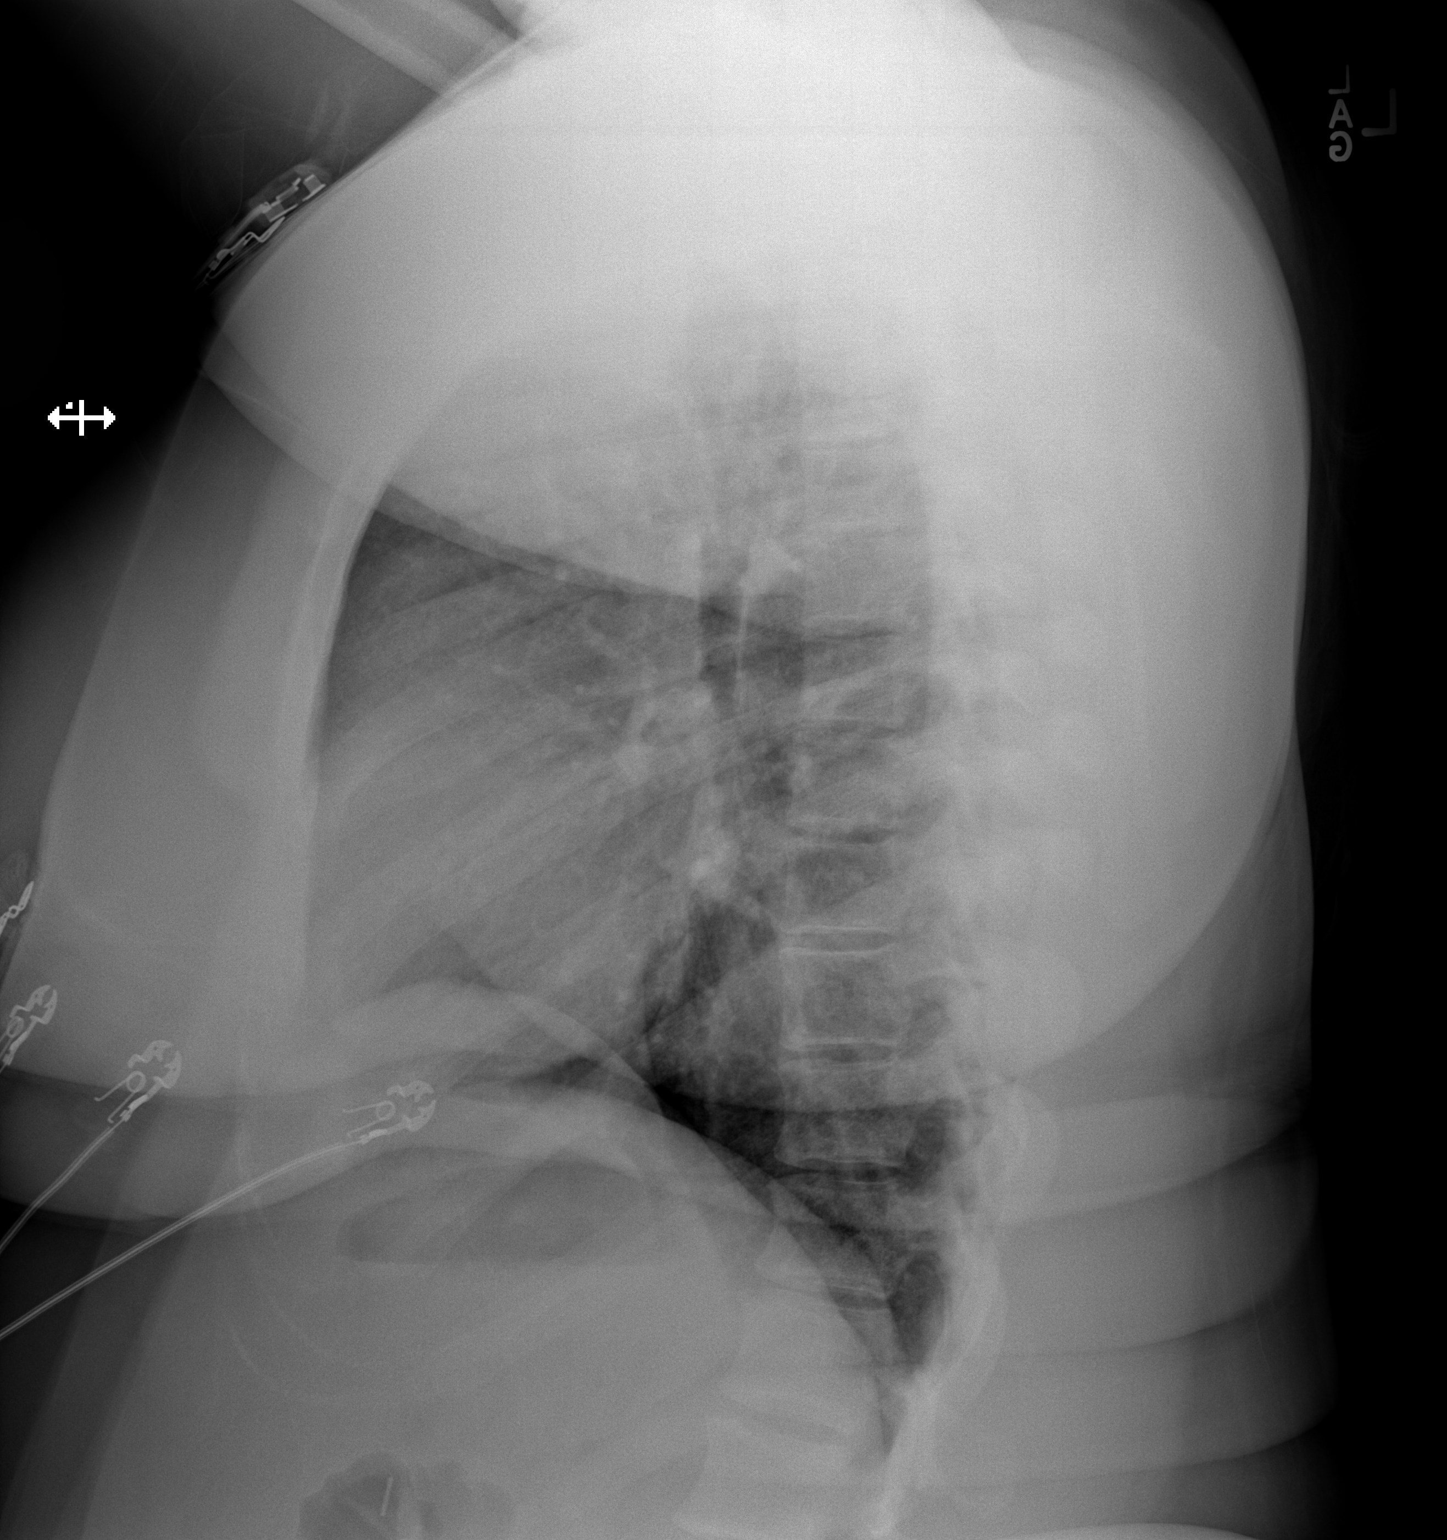

[2 of 2 positions shown; findings below may reference images not displayed]

FINDINGS: The heart size and mediastinal contours are within normal limits.
Both lungs are clear. The visualized skeletal structures are
unremarkable.
IMPRESSION: No active cardiopulmonary disease.

## 2014-08-20 MED ORDER — ACETAMINOPHEN 500 MG PO TABS
1000.0000 mg | ORAL_TABLET | Freq: Once | ORAL | Status: AC
  Administered 2014-08-20: 1000 mg via ORAL
  Filled 2014-08-20: qty 2

## 2014-08-20 MED ORDER — PREDNISONE 20 MG PO TABS
60.0000 mg | ORAL_TABLET | Freq: Every day | ORAL | Status: DC
Start: 2014-08-20 — End: 2014-09-17

## 2014-08-20 NOTE — ED Provider Notes (Signed)
CSN: [id number]     Arrival date & time 08/20/14  0035 History   First MD Initiated Contact with Patient 08/20/14 0112     Chief Complaint  Patient presents with  . Asthma     (Consider location/radiation/quality/duration/timing/severity/associated sxs/prior Treatment) HPI 19 year old female presents to the emergency department with complaint of chest pain.  She reports the pain started 2 days ago when she had an asthma attack.  [facility] today with complaint of asthma exacerbation.  She reports that the pain in her chest was not bad at that time, so she forgot to mention it.  Pain is throughout her anterior chest.  It is worse with breathing.  She reports that she has felt slightly lightheaded.  She reports that she has tingling to a spot just to the left of her nose that started an hour ago.  Patient reports that she has had cough.  She reports that she has been taking her albuterol as prescribed, last treatment was an hour and a half ago.  No fevers or chills Past Medical History  Diagnosis Date  . Pain from implanted hardware 11/2011    retained syndesmosis screw right ankle  . Asthma    Past Surgical History  Procedure Laterality Date  . Cholecystectomy    . Tonsillectomy    . Orif ankle fracture  07/2011    right  . Hardware removal  11/22/2011    Procedure: HARDWARE REMOVAL;  Surgeon: Toni ArthursJohn Hewitt, MD;  Location: [facility];  Service: Orthopedics;  Laterality: Right;   Family History  Problem Relation Age of Onset  . Anesthesia problems Mother     post-op N/V  . Hypertension Mother   . Diabetes Father    History  Substance Use Topics  . Smoking status: Current Some Day Smoker  . Smokeless tobacco: Never Used  . Alcohol Use: No   OB History    No data available     Review of Systems   See History of Present Illness; otherwise all other systems are reviewed and negative  Allergies  Review of  patient's allergies indicates no known allergies.  Home Medications   Prior to Admission medications   Medication Sig Start Date End Date Taking? Authorizing Provider  albuterol (PROVENTIL HFA;VENTOLIN HFA) 108 (90 BASE) MCG/ACT inhaler Inhale 1-2 puffs into the lungs every 6 (six) hours as needed for wheezing or shortness of breath. 08/19/14   Marlon Peliffany Greene, PA-C  albuterol (PROVENTIL) (5 MG/ML) 0.5% nebulizer solution Take 0.5 mLs (2.5 mg total) by nebulization every 6 (six) hours as needed for wheezing or shortness of breath. 08/19/14   Tiffany Neva SeatGreene, PA-C  ibuprofen (ADVIL,MOTRIN) 200 MG tablet Take 200 mg by mouth every 6 (six) hours as needed. Pain/ headache    Historical Provider, MD   BP 103/59 mmHg  Pulse 110  Temp(Src) 98 F (36.7 C) (Oral)  Resp 18  SpO2 100%  LMP 08/09/2014 Physical Exam  Constitutional: She is oriented to person, place, and time. She appears well-developed and well-nourished.  HENT:  Head: Normocephalic and atraumatic.  Nose: Nose normal.  Mouth/Throat: Oropharynx is clear and moist.  Eyes: Conjunctivae and EOM are normal. Pupils are equal, round, and reactive to light.  Neck: Normal range of motion. Neck supple. No JVD present. No tracheal deviation present. No thyromegaly present.  Cardiovascular: Normal rate, regular rhythm, normal heart sounds and intact distal pulses.  Exam reveals no gallop and no  friction rub.   No murmur heard. Tachycardia  Pulmonary/Chest: Effort normal and breath sounds normal. No stridor. No respiratory distress. She has no wheezes. She has no rales. She exhibits no tenderness.  Tachypnea  Abdominal: Soft. Bowel sounds are normal. She exhibits no distension and no mass. There is no tenderness. There is no rebound and no guarding.  Musculoskeletal: Normal range of motion. She exhibits no edema or tenderness.  Lymphadenopathy:    She has no cervical adenopathy.  Neurological: She is alert and oriented to person, place, and time.  She displays normal reflexes. She exhibits normal muscle tone. Coordination normal.  Skin: Skin is warm and dry. No rash noted. No erythema. No pallor.  Psychiatric: She has a normal mood and affect. Her behavior is normal. Judgment and thought content normal.  Nursing note and vitals reviewed.   ED Course  Procedures (including critical care time) Labs Review Labs Reviewed  BASIC METABOLIC PANEL - Abnormal; Notable for the following:    Potassium 3.2 (*)    CO2 19 (*)    Glucose, Bld 243 (*)    Calcium 8.8 (*)    All other components within normal limits  D-DIMER, QUANTITATIVE    Imaging Review Dg Chest 2 View  08/20/2014   CLINICAL DATA:  Dyspnea for 48 hour  EXAM: CHEST  2 VIEW  COMPARISON:  None.  FINDINGS: The heart size and mediastinal contours are within normal limits. Both lungs are clear. The visualized skeletal structures are unremarkable.  IMPRESSION: No active cardiopulmonary disease.   Electronically Signed   By: Ellery Plunkaniel R Mitchell M.D.   On: 08/20/2014 01:58     EKG Interpretation   Date/Time:  Friday Aug 20 2014 01:16:52 EDT Ventricular Rate:  107 PR Interval:  143 QRS Duration: 108 QT Interval:  340 QTC Calculation: 454 R Axis:   61 Text Interpretation:  Sinus tachycardia Probable left ventricular  hypertrophy Confirmed by Milano Rosevear  MD, Gailene Youkhana ([id number]) on 08/20/2014 1:32:18 AM      MDM   Final diagnoses:  Chest pain, non-cardiac    19 yo female with chest pain for 2 days, also with asthma exacerbation. No wheezing on today's exam, but has tachycardia out of proportion of albuterol tx 1.5 hours ago.  Will check cxr, ddimer.  Chest x-ray and d-dimer, both negative.  Suspect patient has some dehydration causing the tachycardia.  She feels better after Tylenol.  Plan for discharge home.    Marisa Severinlga Jabier Deese, MD 08/20/14 [phone]

## 2014-08-20 NOTE — Discharge Instructions (Signed)
Your chest x-ray today did not show any signs of pneumonia or other abnormalities.  Your heart rate was high, it is recommended that you drink more water.  Take prednisone as prescribed.  Follow-up with your Dr. for recheck in 3-5 days.    Chest Pain (Nonspecific) It is often hard to give a specific diagnosis for the cause of chest pain. There is always a chance that your pain could be related to something serious, such as a heart attack or a blood clot in the lungs. You need to follow up with your health care provider for further evaluation. CAUSES   Heartburn.  Pneumonia or bronchitis.  Anxiety or stress.  Inflammation around your heart (pericarditis) or lung (pleuritis or pleurisy).  A blood clot in the lung.  A collapsed lung (pneumothorax). It can develop suddenly on its own (spontaneous pneumothorax) or from trauma to the chest.  Shingles infection (herpes zoster virus). The chest wall is composed of bones, muscles, and cartilage. Any of these can be the source of the pain.  The bones can be bruised by injury.  The muscles or cartilage can be strained by coughing or overwork.  The cartilage can be affected by inflammation and become sore (costochondritis). DIAGNOSIS  Lab tests or other studies may be needed to find the cause of your pain. Your health care provider may have you take a test called an ambulatory electrocardiogram (ECG). An ECG records your heartbeat patterns over a 24-hour period. You may also have other tests, such as:  Transthoracic echocardiogram (TTE). During echocardiography, sound waves are used to evaluate how blood flows through your heart.  Transesophageal echocardiogram (TEE).  Cardiac monitoring. This allows your health care provider to monitor your heart rate and rhythm in real time.  Holter monitor. This is a portable device that records your heartbeat and can help diagnose heart arrhythmias. It allows your health care provider to track your heart  activity for several days, if needed.  Stress tests by exercise or by giving medicine that makes the heart beat faster. TREATMENT   Treatment depends on what may be causing your chest pain. Treatment may include:  Acid blockers for heartburn.  Anti-inflammatory medicine.  Pain medicine for inflammatory conditions.  Antibiotics if an infection is present.  You may be advised to change lifestyle habits. This includes stopping smoking and avoiding alcohol, caffeine, and chocolate.  You may be advised to keep your head raised (elevated) when sleeping. This reduces the chance of acid going backward from your stomach into your esophagus. Most of the time, nonspecific chest pain will improve within 2-3 days with rest and mild pain medicine.  HOME CARE INSTRUCTIONS   If antibiotics were prescribed, take them as directed. Finish them even if you start to feel better.  For the next few days, avoid physical activities that bring on chest pain. Continue physical activities as directed.  Do not use any tobacco products, including cigarettes, chewing tobacco, or electronic cigarettes.  Avoid drinking alcohol.  Only take medicine as directed by your health care provider.  Follow your health care provider's suggestions for further testing if your chest pain does not go away.  Keep any follow-up appointments you made. If you do not go to an appointment, you could develop lasting (chronic) problems with pain. If there is any problem keeping an appointment, call to reschedule. SEEK MEDICAL CARE IF:   Your chest pain does not go away, even after treatment.  You have a rash with blisters on  your chest.  You have a fever. SEEK IMMEDIATE MEDICAL CARE IF:   You have increased chest pain or pain that spreads to your arm, neck, jaw, back, or abdomen.  You have shortness of breath.  You have an increasing cough, or you cough up blood.  You have severe back or abdominal pain.  You feel  nauseous or vomit.  You have severe weakness.  You faint.  You have chills. This is an emergency. Do not wait to see if the pain will go away. Get medical help at once. Call your local emergency services (911 in [place] Do not drive yourself to the hospital. MAKE SURE YOU:   Understand these instructions.  Will watch your condition.  Will get help right away if you are not doing well or get worse. Document Released: 01/10/2005 Document Revised: 04/07/2013 Document Reviewed: 11/06/2007 [facility] Patient Information 2015 [place]. This information is not intended to replace advice given to you by your health care provider. Make sure you discuss any questions you have with your health care provider.  Musculoskeletal Pain Musculoskeletal pain is muscle and boney aches and pains. These pains can occur in any part of the body. Your caregiver may treat you without knowing the cause of the pain. They may treat you if blood or urine tests, X-rays, and other tests were normal.  CAUSES There is often not a definite cause or reason for these pains. These pains may be caused by a type of germ (virus). The discomfort may also come from overuse. Overuse includes working out too hard when your body is not fit. Boney aches also come from weather changes. Bone is sensitive to atmospheric pressure changes. HOME CARE INSTRUCTIONS   Ask when your test results will be ready. Make sure you get your test results.  Only take over-the-counter or prescription medicines for pain, discomfort, or fever as directed by your caregiver. If you were given medications for your condition, do not drive, operate machinery or power tools, or sign legal documents for 24 hours. Do not drink alcohol. Do not take sleeping pills or other medications that may interfere with treatment.  Continue all activities unless the activities cause more pain. When the pain lessens, slowly resume normal activities. Gradually increase the  intensity and duration of the activities or exercise.  During periods of severe pain, bed rest may be helpful. Lay or sit in any position that is comfortable.  Putting ice on the injured area.  Put ice in a bag.  Place a towel between your skin and the bag.  Leave the ice on for 15 to 20 minutes, 3 to 4 times a day.  Follow up with your caregiver for continued problems and no reason can be found for the pain. If the pain becomes worse or does not go away, it may be necessary to repeat tests or do additional testing. Your caregiver may need to look further for a possible cause. SEEK IMMEDIATE MEDICAL CARE IF:  You have pain that is getting worse and is not relieved by medications.  You develop chest pain that is associated with shortness or breath, sweating, feeling sick to your stomach (nauseous), or throw up (vomit).  Your pain becomes localized to the abdomen.  You develop any new symptoms that seem different or that concern you. MAKE SURE YOU:   Understand these instructions.  Will watch your condition.  Will get help right away if you are not doing well or get worse. Document Released: 04/02/2005 Document  Revised: 06/25/2011 Document Reviewed: 12/05/2012 [facility] Patient Information 2015 [place], [place]. This information is not intended to replace advice given to you by your health care provider. Make sure you discuss any questions you have with your health care provider.

## 2014-08-20 NOTE — ED Notes (Signed)
Pt states she has been having chest pain and shortness of breath x 2 days  Pt states she went to the dr today and he refilled her prescriptions and her inhaler  Pt states tonight she feels worse

## 2014-08-20 NOTE — ED Notes (Signed)
MD at bedside. 

## 2014-09-17 ENCOUNTER — Emergency Department (HOSPITAL_BASED_OUTPATIENT_CLINIC_OR_DEPARTMENT_OTHER)
Admission: EM | Admit: 2014-09-17 | Discharge: 2014-09-17 | Disposition: A | Discharge status: Home / Self Care | Payer: 59 | Attending: Emergency Medicine | Admitting: Emergency Medicine

## 2014-09-17 ENCOUNTER — Emergency Department (HOSPITAL_BASED_OUTPATIENT_CLINIC_OR_DEPARTMENT_OTHER): Payer: 59

## 2014-09-17 ENCOUNTER — Encounter (HOSPITAL_BASED_OUTPATIENT_CLINIC_OR_DEPARTMENT_OTHER): Payer: Self-pay | Admitting: *Deleted

## 2014-09-17 DIAGNOSIS — R05 Cough: Secondary | ICD-10-CM | POA: Diagnosis present

## 2014-09-17 DIAGNOSIS — Z79899 Other long term (current) drug therapy: Secondary | ICD-10-CM | POA: Insufficient documentation

## 2014-09-17 DIAGNOSIS — J45909 Unspecified asthma, uncomplicated: Secondary | ICD-10-CM | POA: Diagnosis not present

## 2014-09-17 DIAGNOSIS — Z720 Tobacco use: Secondary | ICD-10-CM | POA: Insufficient documentation

## 2014-09-17 DIAGNOSIS — J40 Bronchitis, not specified as acute or chronic: Secondary | ICD-10-CM

## 2014-09-17 IMAGING — DX DG CHEST 2V
2 series · 2 of 2 positions shown · non-contrast
Comparison: [DATE]

CLINICAL DATA: Cough and shortness breath. Chest discomfort and
dizziness for 2 days. Headache. Asthma.

EXAM:
CHEST  2 VIEW

[chest pa]
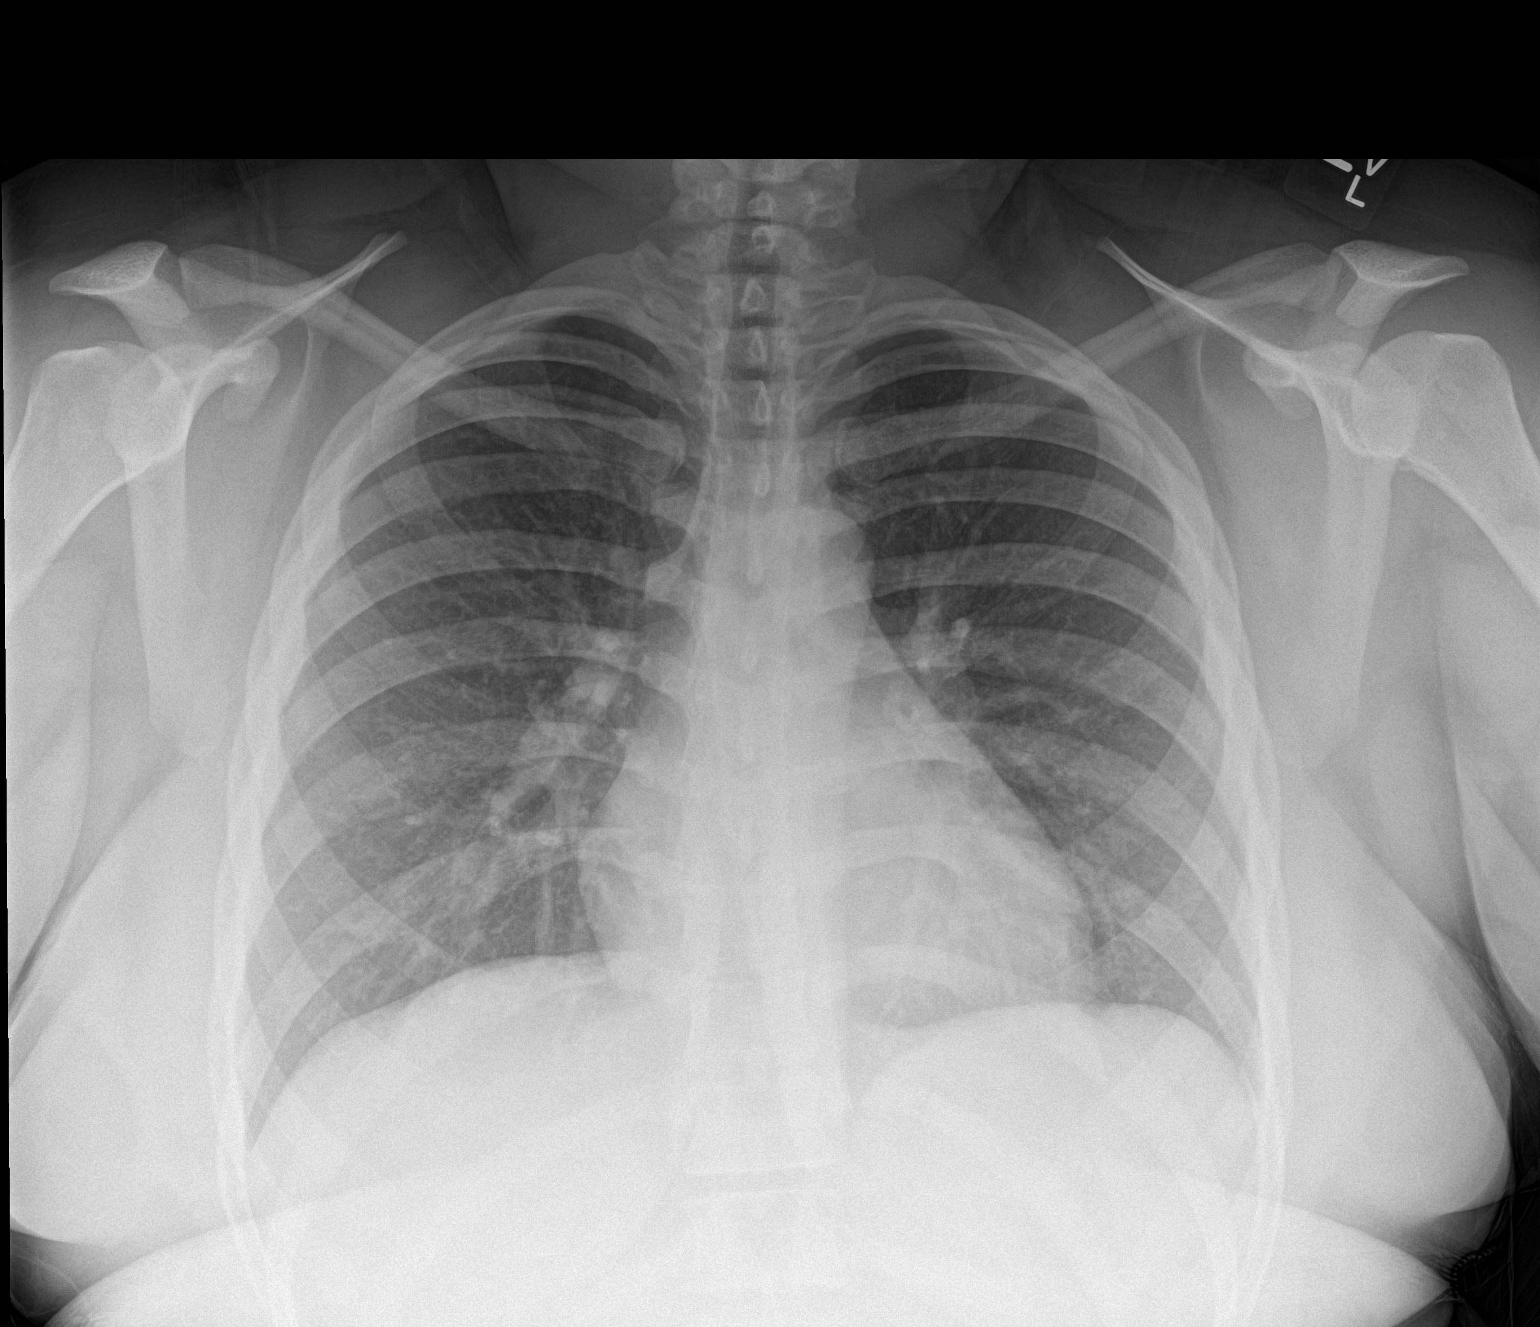

[chest lat]
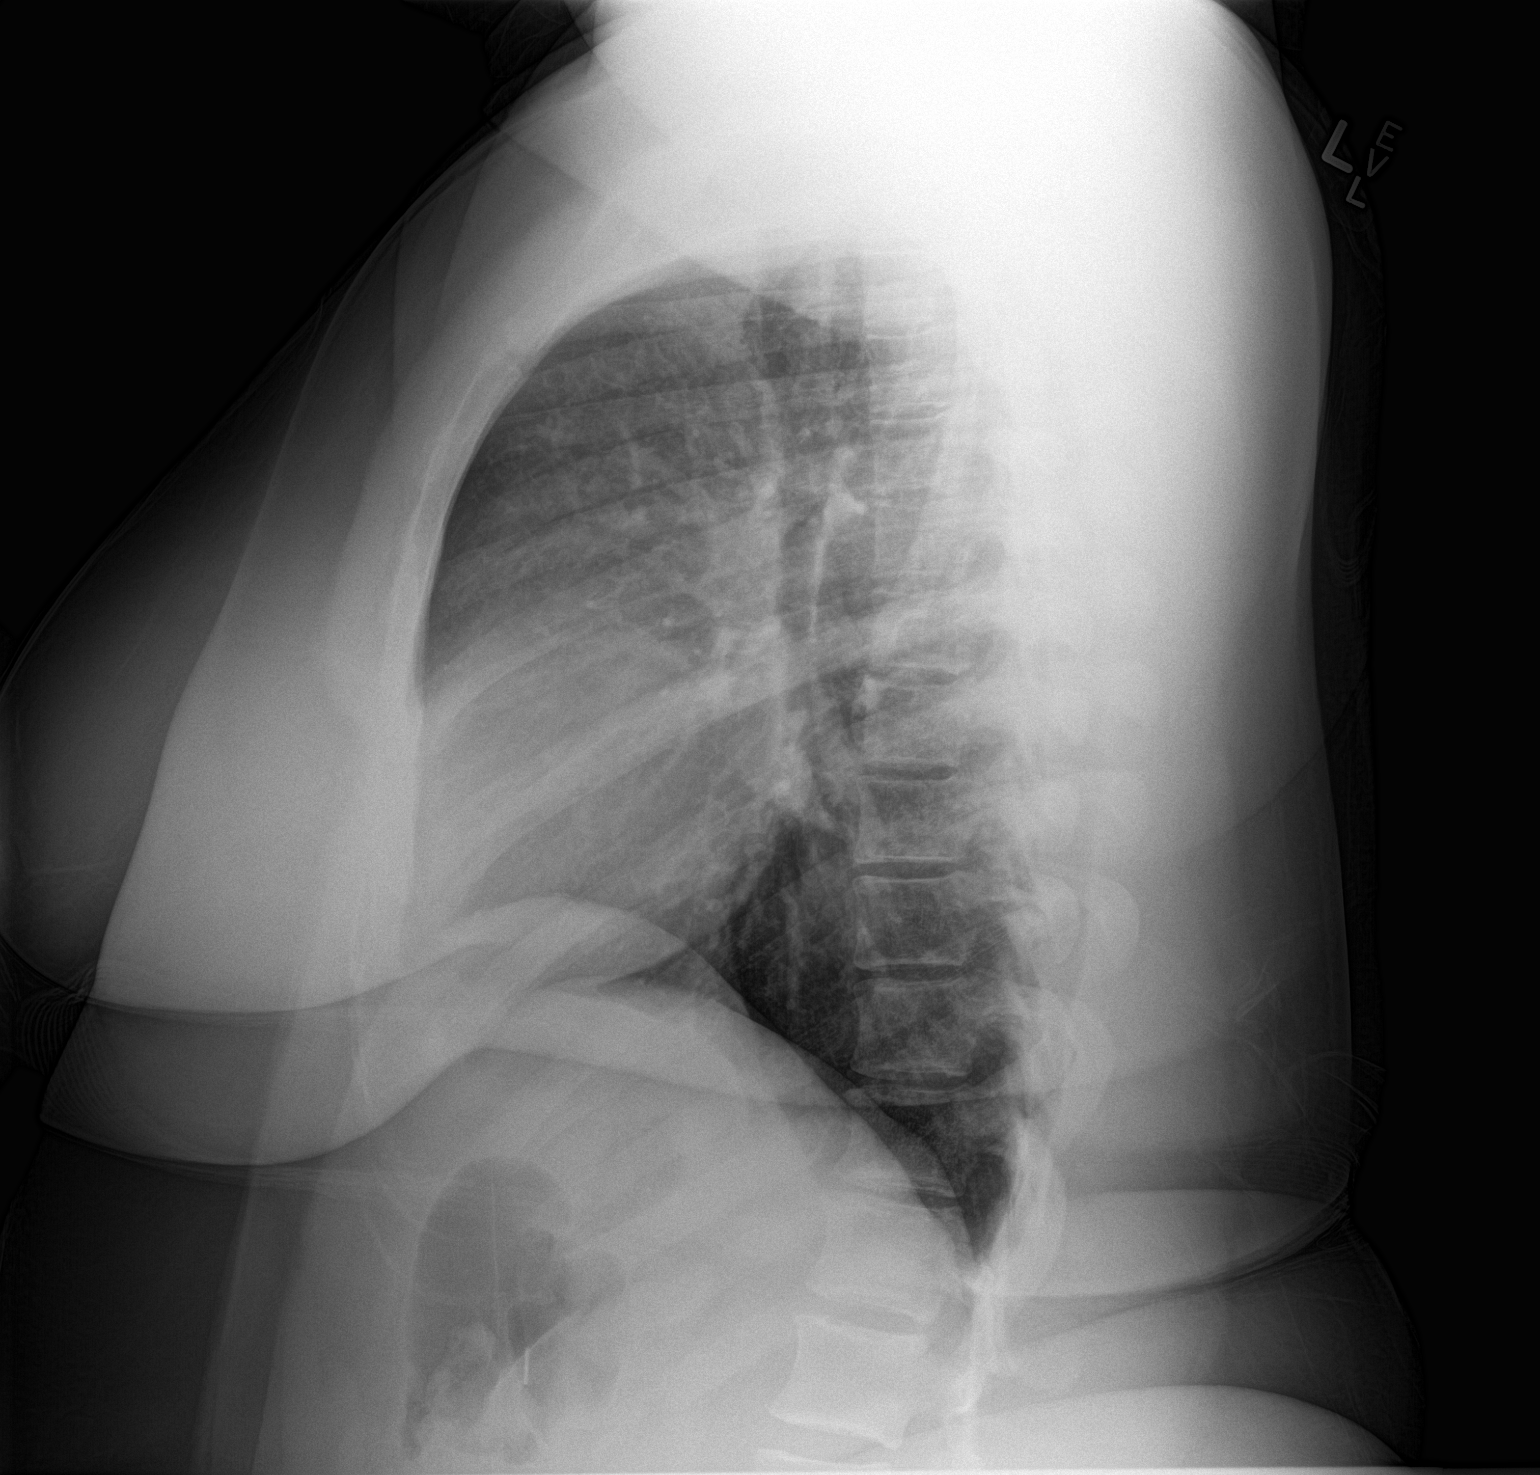

[2 of 2 positions shown; findings below may reference images not displayed]

FINDINGS: The heart size and mediastinal contours are within normal limits.
Both lungs are clear. The visualized skeletal structures are
unremarkable.
IMPRESSION: Negative.  No active cardiopulmonary disease.

## 2014-09-17 MED ORDER — ACETAMINOPHEN-CODEINE 120-12 MG/5ML PO SOLN: 10.0000 mL | ORAL | Status: DC | PRN

## 2014-09-17 MED ORDER — PREDNISONE 50 MG PO TABS
60.0000 mg | ORAL_TABLET | Freq: Once | ORAL | Status: AC
  Administered 2014-09-17: 60 mg via ORAL
  Filled 2014-09-17 (×2): qty 1

## 2014-09-17 MED ORDER — IPRATROPIUM-ALBUTEROL 0.5-2.5 (3) MG/3ML IN SOLN
3.0000 mL | Freq: Once | RESPIRATORY_TRACT | Status: AC
  Administered 2014-09-17: 3 mL via RESPIRATORY_TRACT
  Filled 2014-09-17: qty 3

## 2014-09-17 MED ORDER — GUAIFENESIN ER 1200 MG PO TB12: 1.0000 | ORAL_TABLET | Freq: Two times a day (BID) | ORAL | Status: DC

## 2014-09-17 MED ORDER — PREDNISONE 50 MG PO TABS: 50.0000 mg | ORAL_TABLET | Freq: Every day | ORAL | Status: DC

## 2014-09-17 NOTE — ED Notes (Signed)
Lungs Clear and absent of SOB

## 2014-09-17 NOTE — Discharge Instructions (Signed)
Return here as needed.  Follow-up with your primary care doctor.  X-ray did not show any abnormality

## 2014-09-17 NOTE — ED Notes (Signed)
PA at bedside.

## 2014-09-17 NOTE — ED Provider Notes (Signed)
CSN: [id number]     Arrival date & time 09/17/14  1922 History   First MD Initiated Contact with Patient 09/17/14 1931     Chief Complaint  Patient presents with  . Cough     (Consider location/radiation/quality/duration/timing/severity/associated sxs/prior Treatment) HPI Patient presents to the emergency department with cough and chest pain for the last few days.  The patient states that her chest hurts worse when she coughs.  The patient states that she does have a history of asthma and has been using her inhaler without relief of her symptoms.  Patient did not take any other medications prior to arrival for her symptoms.  Patient denies nausea, vomiting, weakness, dizziness, headache, blurred vision, back pain, neck pain, fever, sore throat, or syncope.  The patient states that nothing seems make her condition better or worse Past Medical History  Diagnosis Date  . Pain from implanted hardware 11/2011    retained syndesmosis screw right ankle  . Asthma    Past Surgical History  Procedure Laterality Date  . Cholecystectomy    . Tonsillectomy    . Orif ankle fracture  07/2011    right  . Hardware removal  11/22/2011    Procedure: HARDWARE REMOVAL;  Surgeon: Toni Arthurs, MD;  Location: [facility];  Service: Orthopedics;  Laterality: Right;   Family History  Problem Relation Age of Onset  . Anesthesia problems Mother     post-op N/V  . Hypertension Mother   . Diabetes Father    History  Substance Use Topics  . Smoking status: Current Some Day Smoker  . Smokeless tobacco: Never Used  . Alcohol Use: No   OB History    No data available     Review of Systems  All other systems negative except as documented in the HPI. All pertinent positives and negatives as reviewed in the HPI.  Allergies  Tuna  Home Medications   Prior to Admission medications   Medication Sig Start Date End Date Taking? Authorizing Provider  albuterol (PROVENTIL HFA;VENTOLIN HFA) 108  (90 BASE) MCG/ACT inhaler Inhale 1-2 puffs into the lungs every 6 (six) hours as needed for wheezing or shortness of breath. 08/19/14   Marlon Pel, PA-C  albuterol (PROVENTIL) (5 MG/ML) 0.5% nebulizer solution Take 0.5 mLs (2.5 mg total) by nebulization every 6 (six) hours as needed for wheezing or shortness of breath. 08/19/14   Tiffany Neva Seat, PA-C  ibuprofen (ADVIL,MOTRIN) 200 MG tablet Take 200 mg by mouth every 6 (six) hours as needed. Pain/ headache    Historical Provider, MD  predniSONE (DELTASONE) 20 MG tablet Take 3 tablets (60 mg total) by mouth daily. 08/20/14   Marisa Severin, MD   BP 143/87 mmHg  Pulse 101  Temp(Src) 98.9 F (37.2 C) (Oral)  Resp 20  Ht  (1.6 m)  Wt 270 lb (122.471 kg)  BMI 47.84 kg/m2  SpO2 100%  LMP 08/09/2014 Physical Exam  Constitutional: She is oriented to person, place, and time. She appears well-developed and well-nourished. No distress.  HENT:  Head: Normocephalic and atraumatic.  Mouth/Throat: Oropharynx is clear and moist.  Eyes: Pupils are equal, round, and reactive to light.  Neck: Normal range of motion. Neck supple. No thyromegaly present.  Cardiovascular: Normal rate, regular rhythm and normal heart sounds.  Exam reveals no gallop and no friction rub.   No murmur heard. Pulmonary/Chest: Effort normal and breath sounds normal. No respiratory distress. She has no wheezes.  Musculoskeletal: She exhibits no edema.  Neurological: She is alert and oriented to person, place, and time. She exhibits normal muscle tone. Coordination normal.  Skin: Skin is warm and dry. No rash noted. No erythema.  Nursing note and vitals reviewed.   ED Course  Procedures (including critical care time) Labs Review Labs Reviewed - No data to display  Imaging Review Dg Chest 2 View  09/17/2014   CLINICAL DATA:  Cough and shortness breath. Chest discomfort and dizziness for 2 days. Headache. Asthma.  EXAM: CHEST  2 VIEW  COMPARISON:  08/20/2014  FINDINGS: The heart  size and mediastinal contours are within normal limits. Both lungs are clear. The visualized skeletal structures are unremarkable.  IMPRESSION: Negative.  No active cardiopulmonary disease.   Electronically Signed   By: Myles RosenthalJohn  Stahl M.D.   On: 09/17/2014 20:13    A be treated for bronchitis and upper respiratory infection.  Chest x-ray did not show any signs of pneumonia.  Patient has been stable here in the emergency department.  Patient be discharged home and advised to increase her fluid intake, rest as much as possible    Charlestine NightChristopher Jeff Mccallum, PA-C 09/17/14 2138  Charlestine Nighthristopher Damichael Hofman, PA-C 09/17/14 2138  Geoffery Lyonsouglas Delo, MD 09/17/14 [phone]

## 2014-09-17 NOTE — ED Notes (Signed)
Patient was asleep and comfortable on arrival to room to d/c the patient. The patient is very sleepy. Family at the bedside and aware of the d/c instructions

## 2014-09-17 NOTE — ED Notes (Signed)
Cough. Has been using her inhaler more than normal for the past 2 days. Her chest hurts all the time but worse with cough.

## 2014-09-23 ENCOUNTER — Emergency Department (HOSPITAL_COMMUNITY)
Admission: EM | Admit: 2014-09-23 | Discharge: 2014-09-24 | Disposition: A | Discharge status: Home / Self Care | Payer: 59 | Attending: Emergency Medicine | Admitting: Emergency Medicine

## 2014-09-23 ENCOUNTER — Emergency Department (HOSPITAL_COMMUNITY): Payer: 59

## 2014-09-23 ENCOUNTER — Encounter (HOSPITAL_COMMUNITY): Payer: Self-pay

## 2014-09-23 DIAGNOSIS — Z720 Tobacco use: Secondary | ICD-10-CM | POA: Insufficient documentation

## 2014-09-23 DIAGNOSIS — J45901 Unspecified asthma with (acute) exacerbation: Secondary | ICD-10-CM | POA: Diagnosis not present

## 2014-09-23 DIAGNOSIS — R200 Anesthesia of skin: Secondary | ICD-10-CM | POA: Diagnosis not present

## 2014-09-23 DIAGNOSIS — R42 Dizziness and giddiness: Secondary | ICD-10-CM | POA: Insufficient documentation

## 2014-09-23 DIAGNOSIS — H539 Unspecified visual disturbance: Secondary | ICD-10-CM | POA: Insufficient documentation

## 2014-09-23 DIAGNOSIS — R202 Paresthesia of skin: Secondary | ICD-10-CM | POA: Diagnosis not present

## 2014-09-23 DIAGNOSIS — Z79899 Other long term (current) drug therapy: Secondary | ICD-10-CM | POA: Insufficient documentation

## 2014-09-23 DIAGNOSIS — R531 Weakness: Secondary | ICD-10-CM | POA: Diagnosis not present

## 2014-09-23 DIAGNOSIS — R079 Chest pain, unspecified: Secondary | ICD-10-CM | POA: Insufficient documentation

## 2014-09-23 IMAGING — CR DG CHEST 2V
2 series · 2 of 2 positions shown · non-contrast
Comparison: [DATE]

CLINICAL DATA: Chest pain, onset today.

EXAM:
CHEST  2 VIEW

[w chest pa]
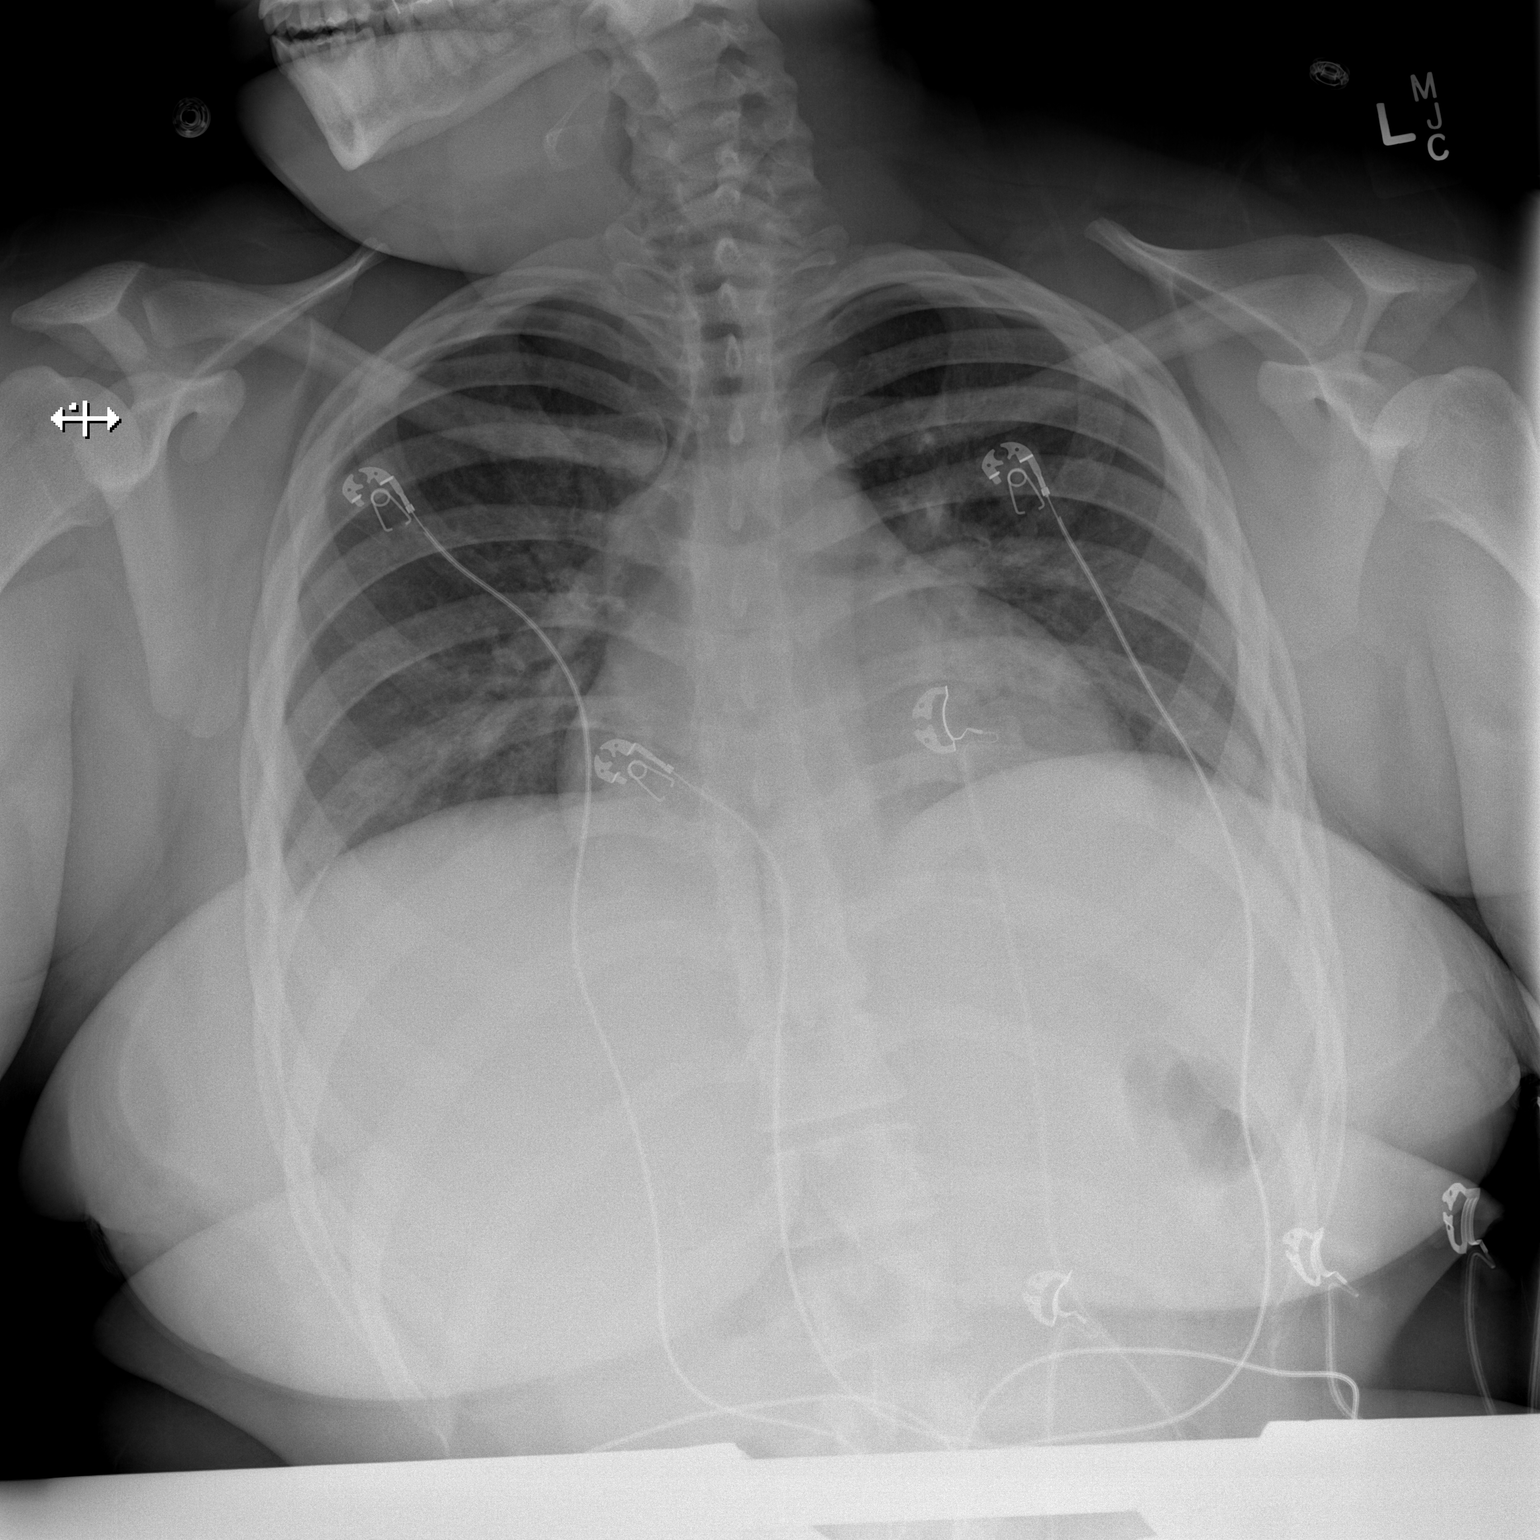

[w chest lat]
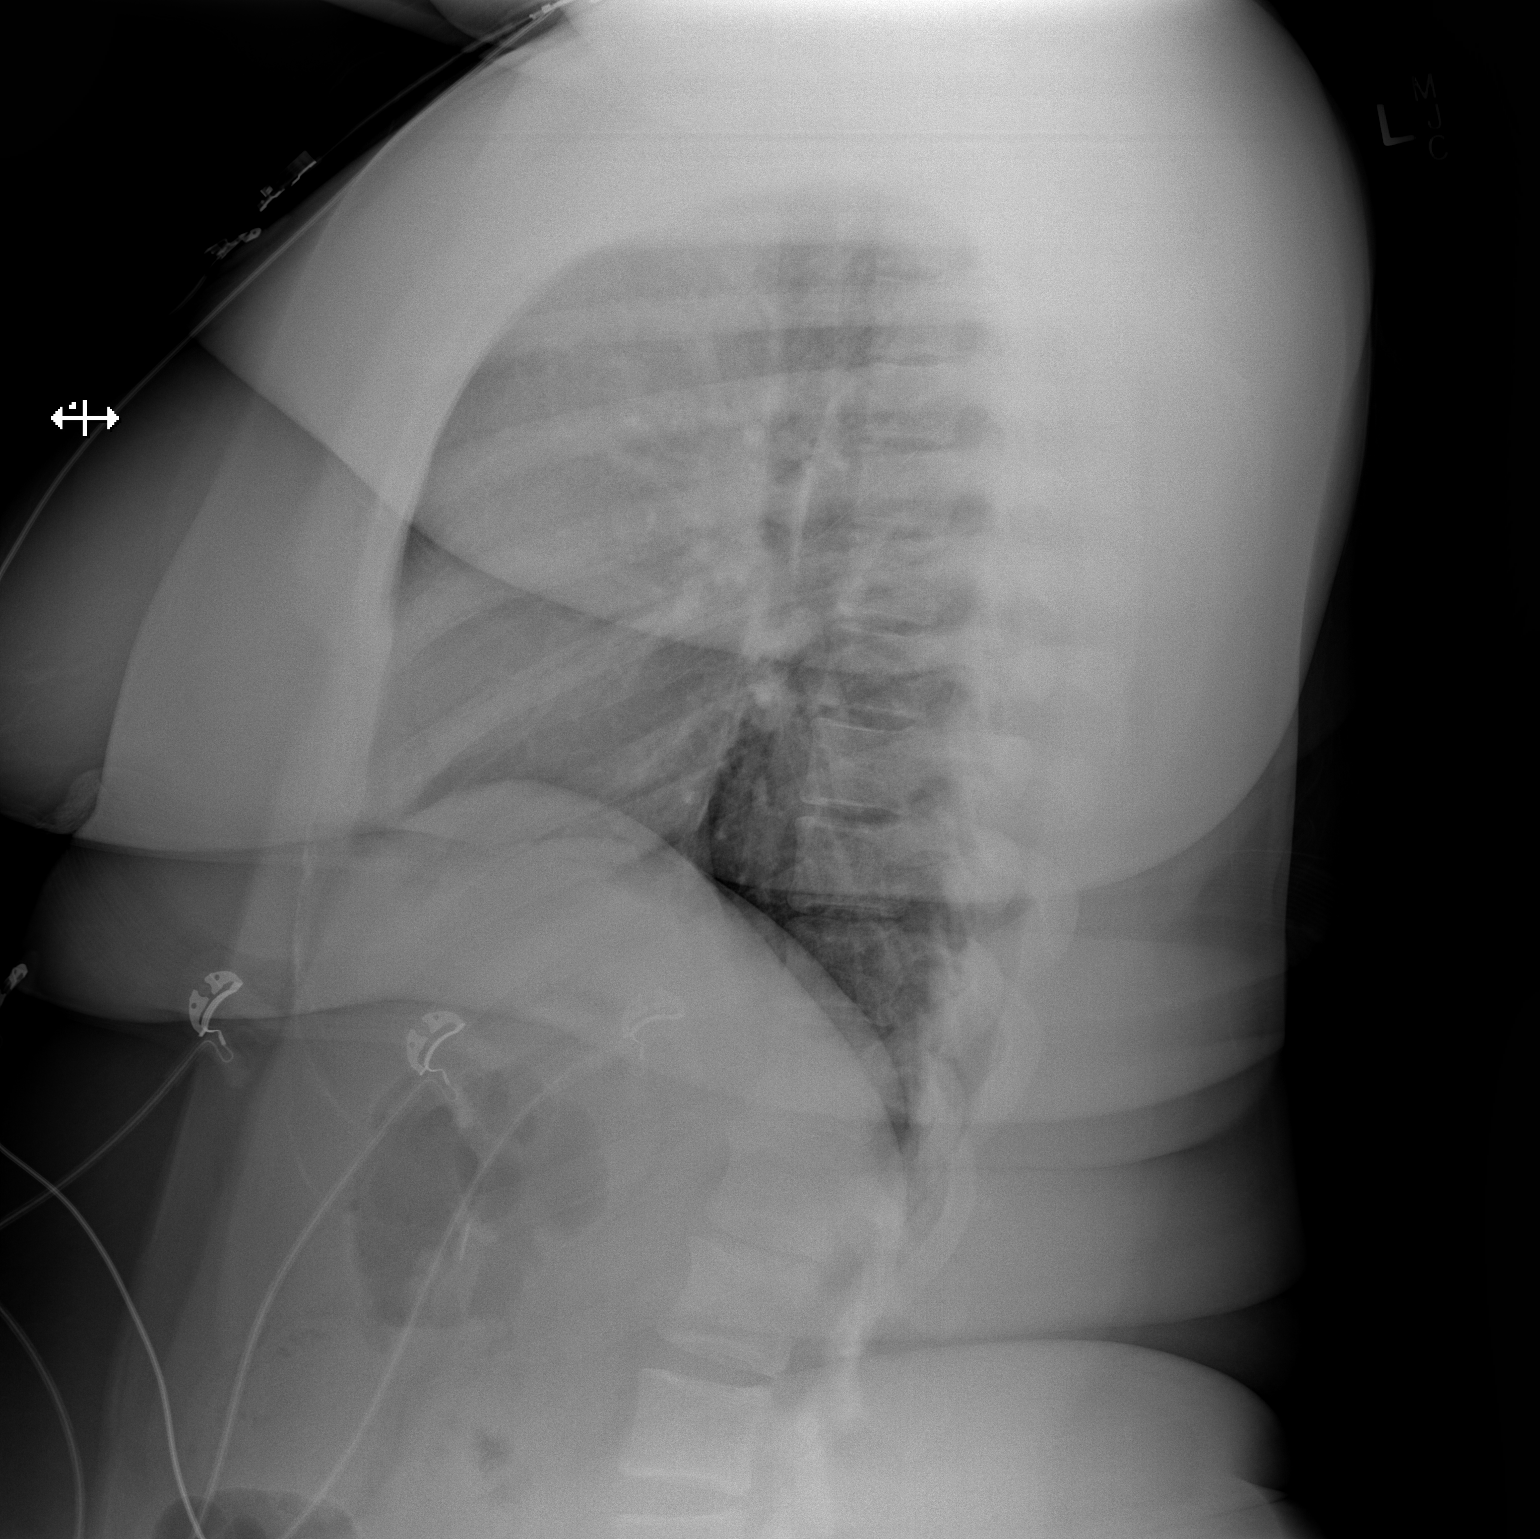

[2 of 2 positions shown; findings below may reference images not displayed]

FINDINGS: The heart size and mediastinal contours are within normal limits.
Both lungs are clear. The visualized skeletal structures are
unremarkable.
IMPRESSION: No active cardiopulmonary disease.

## 2014-09-23 MED ORDER — KETOROLAC TROMETHAMINE 30 MG/ML IJ SOLN
30.0000 mg | Freq: Once | INTRAMUSCULAR | Status: AC
  Administered 2014-09-24: 30 mg via INTRAVENOUS
  Filled 2014-09-23: qty 1

## 2014-09-23 NOTE — ED Notes (Addendum)
Patient reports chest pain which began today.  Reports this as constant. Denies n/v, shortness of breath.  Reports this episode is different than those in the past due to having blurred vision, dizziness.  Patient did not report this information.  Friend sitting at bedside and stating these symptoms.  When I entered room and initially asked patient what brought her to ER patient reported "chest pain that I have had before". Patient also reports weakness to the left side of the body. Reports weakness to left arm and left leg. When speaking with patient during assessment patient able to fully lift left leg off the bed.

## 2014-09-23 NOTE — ED Notes (Signed)
Pt complains of a pressure like chest pain that started about 2 hours ago, pt is tearful in triage, family member states she has been seen before for the same and is always treated for asthma sx

## 2014-09-23 NOTE — ED Notes (Signed)
Pt also states that she's dizzy and weak

## 2014-09-23 NOTE — ED Provider Notes (Signed)
CSN: [id number]     Arrival date & time 09/23/14  2236 History   First MD Initiated Contact with Patient 09/23/14 2304     Chief Complaint  Patient presents with  . Chest Pain     (Consider location/radiation/quality/duration/timing/severity/associated sxs/prior Treatment) HPI  This is a 19 year old female with a history of asthma who presents with chest pain shortness of breath. Patient reports onset of chest pain and shortness of breath approximately 2 hours prior to arrival.  Reports in the past she's been diagnosed with asthma. Pain is reported as pressure like and nonradiating. Current pain is 8 out of 10. She did not report in triage but reported to the nurse that she also has felt numbness in her left arm and left leg and felt like it was difficult to walk because of her leg. Her girlfriend is at the bedside and states that her face looks asymmetrical to her. She reports off balance dizziness and vision changes. Patient reports dizziness gets worse upon standing. Patient denies any recent illnesses, fever, or cough.  Past Medical History  Diagnosis Date  . Pain from implanted hardware 11/2011    retained syndesmosis screw right ankle  . Asthma    Past Surgical History  Procedure Laterality Date  . Cholecystectomy    . Tonsillectomy    . Orif ankle fracture  07/2011    right  . Hardware removal  11/22/2011    Procedure: HARDWARE REMOVAL;  Surgeon: Toni Arthurs, MD;  Location: [facility];  Service: Orthopedics;  Laterality: Right;   Family History  Problem Relation Age of Onset  . Anesthesia problems Mother     post-op N/V  . Hypertension Mother   . Diabetes Father    History  Substance Use Topics  . Smoking status: Current Some Day Smoker  . Smokeless tobacco: Never Used  . Alcohol Use: No   OB History    No data available     Review of Systems  Constitutional: Negative for fever.  Eyes: Positive for visual disturbance. Negative for photophobia.   Respiratory: Positive for chest tightness and shortness of breath. Negative for cough.   Cardiovascular: Positive for chest pain. Negative for leg swelling.  Gastrointestinal: Negative for nausea, vomiting and abdominal pain.  Genitourinary: Negative for dysuria.  Musculoskeletal: Negative for back pain.  Skin: Negative for wound.  Neurological: Positive for dizziness, weakness and numbness. Negative for headaches.  Psychiatric/Behavioral: Negative for confusion.  All other systems reviewed and are negative.     Allergies  Tuna  Home Medications   Prior to Admission medications   Medication Sig Start Date End Date Taking? Authorizing Provider  albuterol (PROVENTIL HFA;VENTOLIN HFA) 108 (90 BASE) MCG/ACT inhaler Inhale 1-2 puffs into the lungs every 6 (six) hours as needed for wheezing or shortness of breath. 08/19/14  Yes Tiffany Neva Seat, PA-C  albuterol (PROVENTIL) (5 MG/ML) 0.5% nebulizer solution Take 0.5 mLs (2.5 mg total) by nebulization every 6 (six) hours as needed for wheezing or shortness of breath. 08/19/14  Yes Tiffany Neva Seat, PA-C  ibuprofen (ADVIL,MOTRIN) 200 MG tablet Take 200 mg by mouth every 6 (six) hours as needed. Pain/ headache   Yes Historical Provider, MD  predniSONE (DELTASONE) 50 MG tablet Take 1 tablet (50 mg total) by mouth daily. 09/17/14  Yes Charlestine Night, PA-C  acetaminophen-codeine 120-12 MG/5ML solution Take 10 mLs by mouth every 4 (four) hours as needed for moderate pain (And cough). Patient not taking: Reported on 09/23/2014 09/17/14   Cristal Deer  Lawyer, PA-C  Guaifenesin 1200 MG TB12 Take 1 tablet (1,200 mg total) by mouth 2 (two) times daily. Patient not taking: Reported on 09/23/2014 09/17/14   Charlestine Night, PA-C  ibuprofen (ADVIL,MOTRIN) 600 MG tablet Take 1 tablet (600 mg total) by mouth every 6 (six) hours as needed. 09/24/14   Shon Baton, MD   BP 144/68 mmHg  Pulse 82  Temp(Src) 98.6 F (37 C) (Oral)  Resp 16  SpO2 100%  LMP  07/24/2014 Physical Exam  Constitutional: She is oriented to person, place, and time. She appears well-developed and well-nourished. No distress.  HENT:  Head: Normocephalic and atraumatic.  Mouth/Throat: Oropharynx is clear and moist.  Eyes: EOM are normal. Pupils are equal, round, and reactive to light.  Neck: Neck supple.  Cardiovascular: Normal rate, regular rhythm and normal heart sounds.   No murmur heard. Pulmonary/Chest: Effort normal and breath sounds normal. No respiratory distress. She has no wheezes. She exhibits tenderness.  Abdominal: Soft. Bowel sounds are normal. There is no tenderness. There is no rebound.  Musculoskeletal: She exhibits no edema.  Neurological: She is alert and oriented to person, place, and time.  Initially when asked to smile, patient had a symmetric smile with drooping on the right, this was inconsistent. When she talked her face appeared symmetric and when I ask her to smile again, she had symmetric smile, otherwise cranial nerves II through XII intact, 5 and 5 strength in bilateral upper and lower extremities in all muscle groups, no dysmetria to finger-nose-finger  Skin: Skin is warm and dry.  Psychiatric: She has a normal mood and affect.  Nursing note and vitals reviewed.   ED Course  Procedures (including critical care time) Labs Review Labs Reviewed  CBC WITH DIFFERENTIAL/PLATELET - Abnormal; Notable for the following:    Hemoglobin 9.8 (*)    HCT 31.6 (*)    MCV 73.3 (*)    MCH 22.7 (*)    RDW 16.8 (*)    All other components within normal limits  BASIC METABOLIC PANEL - Abnormal; Notable for the following:    Glucose, Bld 105 (*)    Calcium 8.5 (*)    All other components within normal limits  URINALYSIS, ROUTINE W REFLEX MICROSCOPIC (NOT AT [facility])  D-DIMER, QUANTITATIVE (NOT AT [facility])    Imaging Review Dg Chest 2 View  09/24/2014   CLINICAL DATA:  Chest pain, onset today.  EXAM: CHEST  2 VIEW  COMPARISON:  09/17/2014  FINDINGS: The  heart size and mediastinal contours are within normal limits. Both lungs are clear. The visualized skeletal structures are unremarkable.  IMPRESSION: No active cardiopulmonary disease.   Electronically Signed   By: Ellery Plunk M.D.   On: 09/24/2014 00:14   Ct Head Wo Contrast  09/24/2014   CLINICAL DATA:  Weakness lightheadedness and visual disturbances, intermittent for 3 months.  EXAM: CT HEAD WITHOUT CONTRAST  TECHNIQUE: Contiguous axial images were obtained from the base of the skull through the vertex without intravenous contrast.  COMPARISON:  02/23/2012  FINDINGS: There is no intracranial hemorrhage, mass or evidence of acute infarction. There is no extra-axial fluid collection. Gray matter and white matter appear normal. Cerebral volume is normal for age. Brainstem and posterior fossa are unremarkable. The CSF spaces appear normal.  The bony structures are intact. The visible portions of the paranasal sinuses are clear.  IMPRESSION: Normal brain   Electronically Signed   By: Ellery Plunk M.D.   On: 09/24/2014 00:15  EKG Interpretation   Date/Time:  Thursday September 23 2014 22:47:05 EDT Ventricular Rate:  85 PR Interval:  152 QRS Duration: 109 QT Interval:  373 QTC Calculation: 443 R Axis:   79 Text Interpretation:  Sinus rhythm Non-specific intra-ventricular  conduction delay ECG OTHERWISE WITHIN NORMAL LIMITS since last tracing no  significant change Confirmed by MILLER  MD, BRIAN ([id number]) on 09/23/2014  10:54:40 PM      MDM   Final diagnoses:  Chest pain, unspecified chest pain type  Dizziness  Paresthesia   Patient initially presented with chest pain. On my evaluation, has multiple complaints. Nontoxic on exam. No obvious focal deficits. Vital signs are reassuring. EKG without evidence of arrhythmia. Orthostatics normal. Head CT negative. Chest x-ray and d-dimer negative. Low suspicion for ACS given patient's low risk factors.  Patient's chest pain is reproducible on  exam. Patient given fluids and Toradol. Resting comfortably on reevaluation. Patient instructed to follow-up with primary physician if symptoms persist or worsen.  After history, exam, and medical workup I feel the patient has been appropriately medically screened and is safe for discharge home. Pertinent diagnoses were discussed with the patient. Patient was given return precautions.     Shon Baton, MD 09/24/14 [phone]

## 2014-09-24 LAB — CBC WITH DIFFERENTIAL/PLATELET
Basophils Absolute: 0 10*3/uL (ref 0.0–0.1)
Basophils Relative: 0 % (ref 0–1)
Eosinophils Absolute: 0.3 10*3/uL (ref 0.0–0.7)
Eosinophils Relative: 3 % (ref 0–5)
HCT: 31.6 % — ABNORMAL LOW (ref 36.0–46.0)
Hemoglobin: 9.8 g/dL — ABNORMAL LOW (ref 12.0–15.0)
Lymphocytes Relative: 36 % (ref 12–46)
Lymphs Abs: 3.5 10*3/uL (ref 0.7–4.0)
MCH: 22.7 pg — ABNORMAL LOW (ref 26.0–34.0)
MCHC: 31 g/dL (ref 30.0–36.0)
MCV: 73.3 fL — ABNORMAL LOW (ref 78.0–100.0)
Monocytes Absolute: 0.7 10*3/uL (ref 0.1–1.0)
Monocytes Relative: 7 % (ref 3–12)
Neutro Abs: 5.3 10*3/uL (ref 1.7–7.7)
Neutrophils Relative %: 54 % (ref 43–77)
Platelets: 332 10*3/uL (ref 150–400)
RBC: 4.31 MIL/uL (ref 3.87–5.11)
RDW: 16.8 % — ABNORMAL HIGH (ref 11.5–15.5)
WBC: 9.8 10*3/uL (ref 4.0–10.5)

## 2014-09-24 LAB — BASIC METABOLIC PANEL
Anion gap: 6 (ref 5–15)
BUN: 13 mg/dL (ref 6–20)
CO2: 27 mmol/L (ref 22–32)
Calcium: 8.5 mg/dL — ABNORMAL LOW (ref 8.9–10.3)
Chloride: 104 mmol/L (ref 101–111)
Creatinine, Ser: 0.9 mg/dL (ref 0.44–1.00)
GFR calc Af Amer: >60 mL/min (ref >60)
GFR calc non Af Amer: >60 mL/min (ref >60)
Glucose, Bld: 105 mg/dL — ABNORMAL HIGH (ref 65–99)
Potassium: 4.1 mmol/L (ref 3.5–5.1)
Sodium: 137 mmol/L (ref 135–145)

## 2014-09-24 LAB — URINALYSIS, ROUTINE W REFLEX MICROSCOPIC
Bilirubin Urine: NEGATIVE
Glucose, UA: NEGATIVE mg/dL
Hgb urine dipstick: NEGATIVE
Ketones, ur: NEGATIVE mg/dL
Leukocytes, UA: NEGATIVE
Nitrite: NEGATIVE
Protein, ur: NEGATIVE mg/dL
Specific Gravity, Urine: 1.026 (ref 1.005–1.030)
Urobilinogen, UA: 1 mg/dL (ref 0.0–1.0)
pH: 6 (ref 5.0–8.0)

## 2014-09-24 LAB — D-DIMER, QUANTITATIVE: D-Dimer, Quant: <0.27 ug/mL-FEU (ref 0.00–0.48)

## 2014-09-24 IMAGING — CT CT HEAD W/O CM
2 series · 16 of 30 positions shown, 20 images · non-contrast
Comparison: [DATE]

CLINICAL DATA: Weakness lightheadedness and visual disturbances,
intermittent for 3 months.

EXAM:
CT HEAD WITHOUT CONTRAST
TECHNIQUE: Contiguous axial images were obtained from the base of the skull
through the vertex without intravenous contrast.

[Series 2: head w/o · axial · non-contrast · 0.43mm/px · z∈[+1322,+1457]mm · 13 of 33 slices shown, 17 images]
[im 3/33  brain]
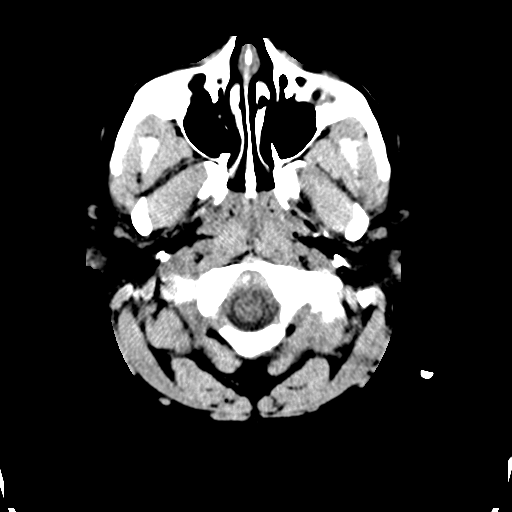
[im 3/33  bone]
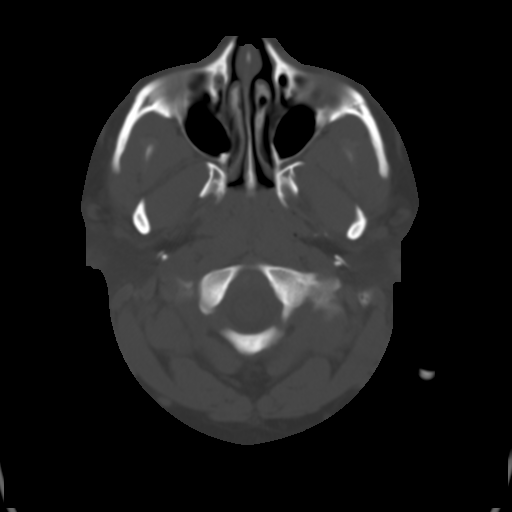
[im 5/33  brain]
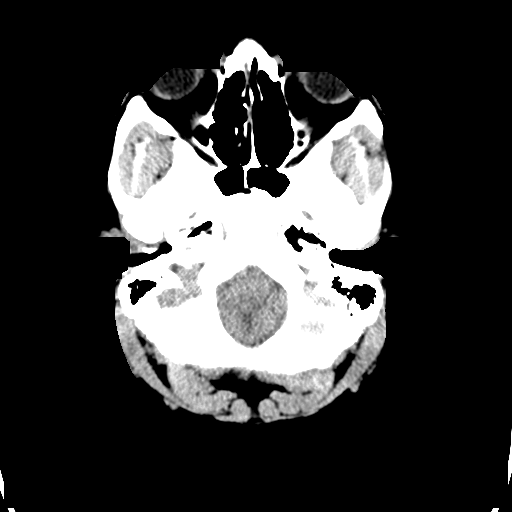
[im 7/33  brain]
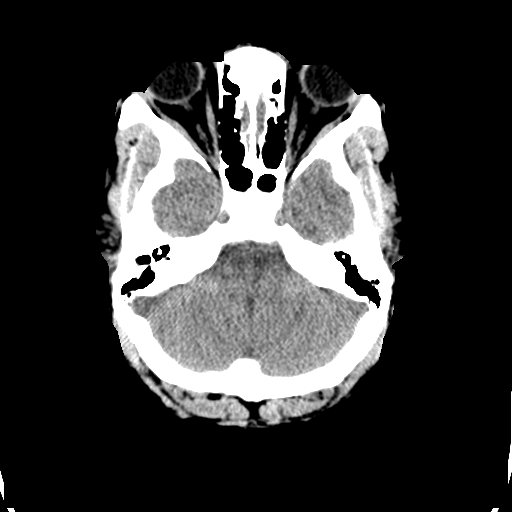
[im 10/33  brain]
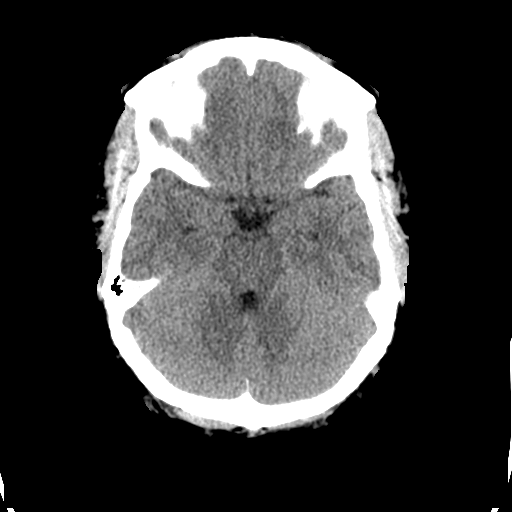
[im 12/33  brain]
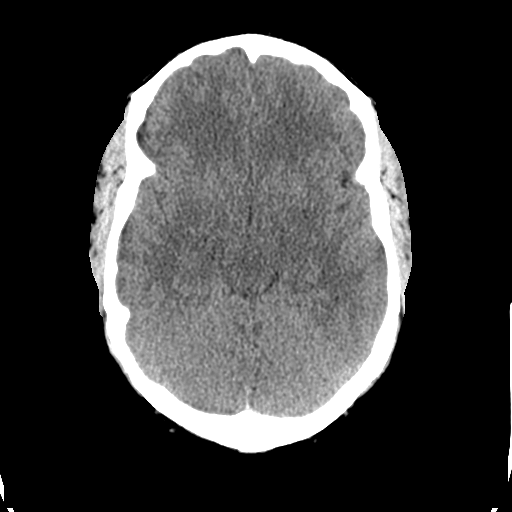
[im 12/33  bone]
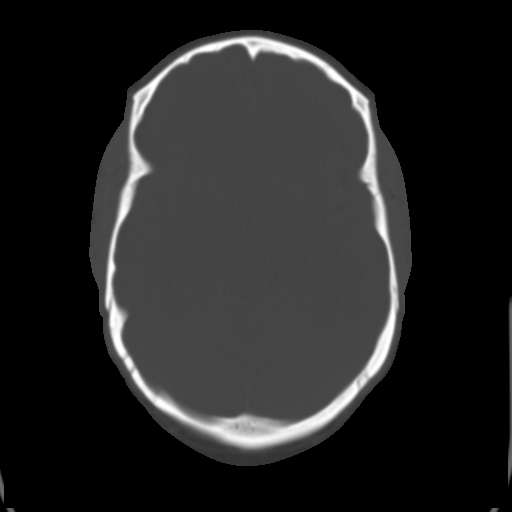
[im 14/33  brain]
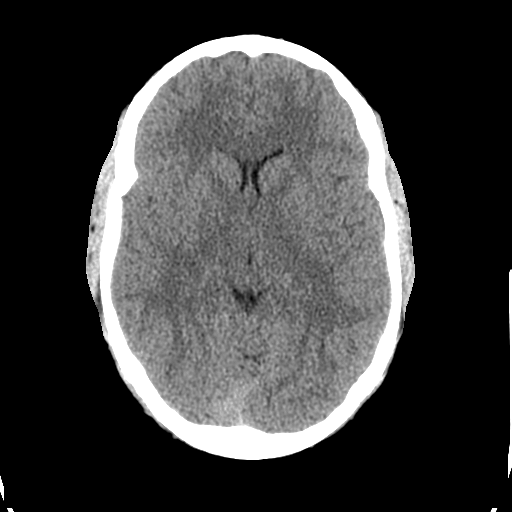
[im 17/33  brain]
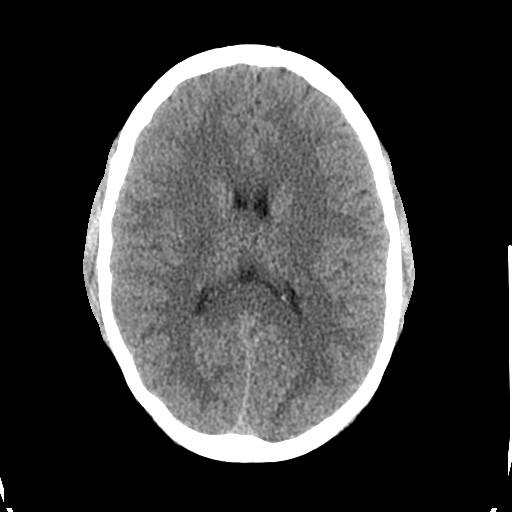
[im 19/33  brain]
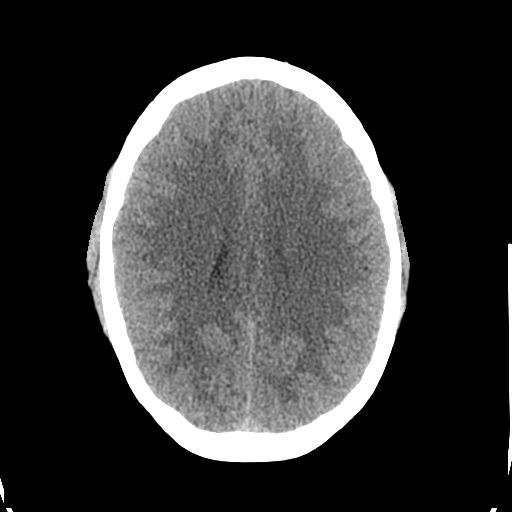
[im 21/33  brain]
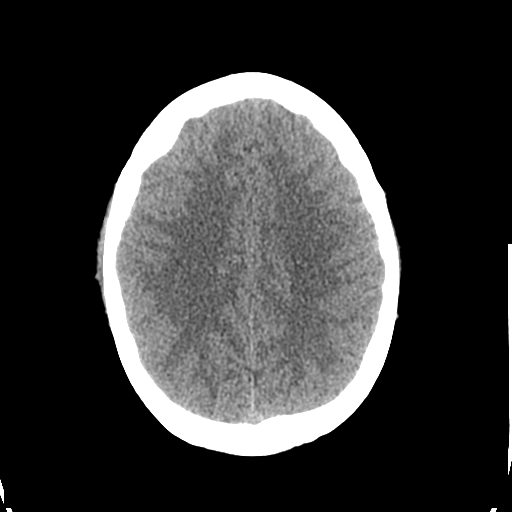
[im 21/33  bone]
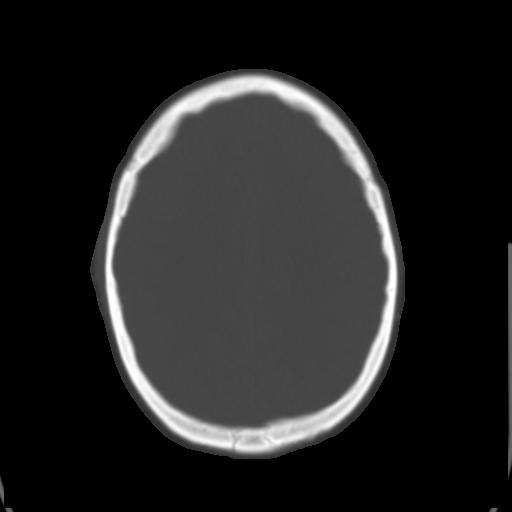
[im 23/33  brain]
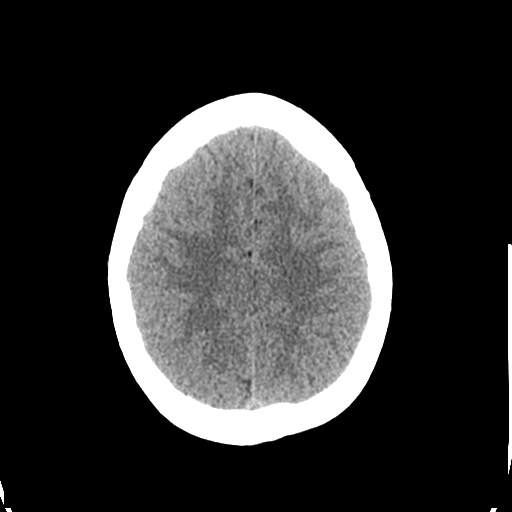
[im 26/33  brain]
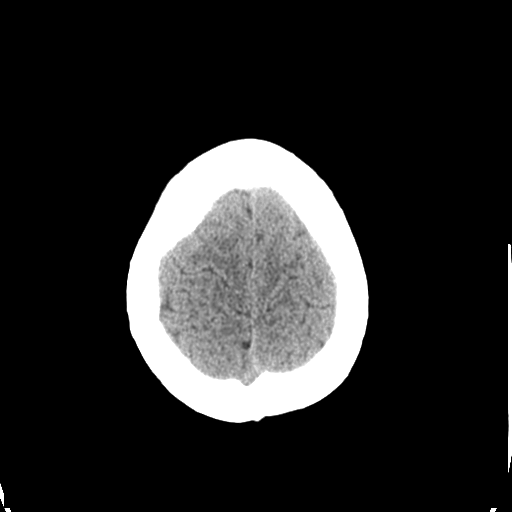
[im 28/33  brain]
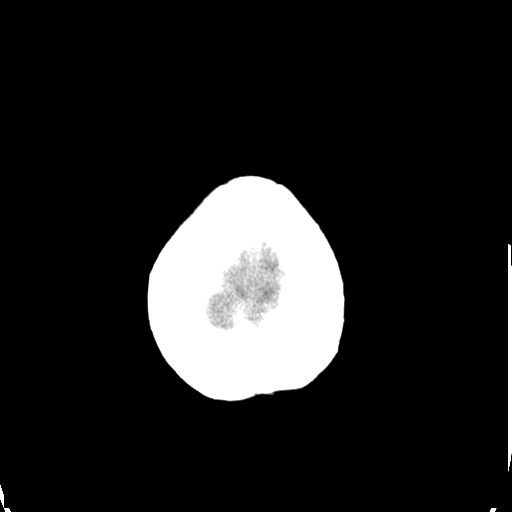
[im 30/33  brain]
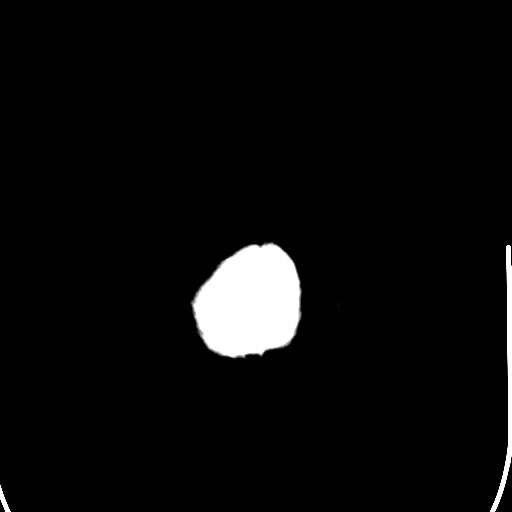
[im 30/33  bone]
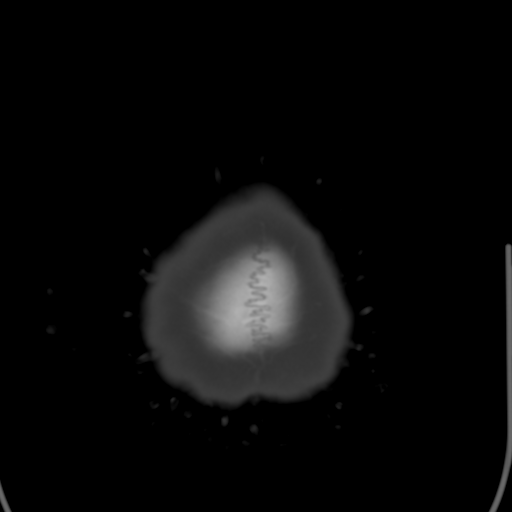

[Series 3: bone windows · axial · 0.43mm/px · z∈[+1322,+1367]mm · 3 of 33 slices shown]
[im 3/33  bone]
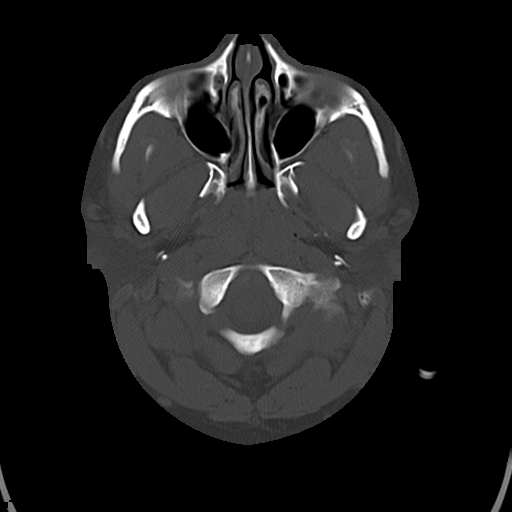
[im 7/33  bone]
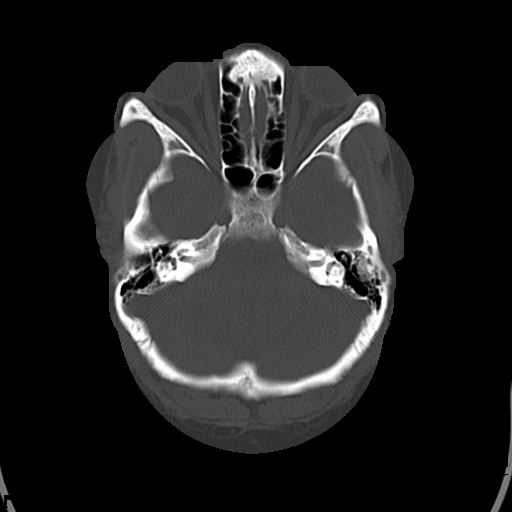
[im 12/33  bone]
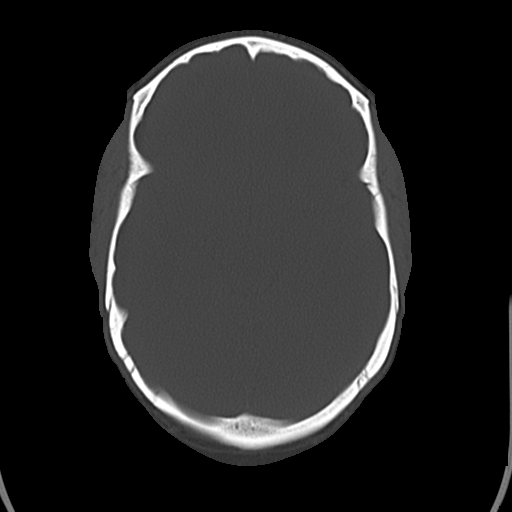

[16 of 30 positions shown; findings below may reference images not displayed]

FINDINGS: There is no intracranial hemorrhage, mass or evidence of acute
infarction. There is no extra-axial fluid collection. Gray matter
and white matter appear normal. Cerebral volume is normal for age.
Brainstem and posterior fossa are unremarkable. The CSF spaces
appear normal.

The bony structures are intact. The visible portions of the
paranasal sinuses are clear.
IMPRESSION: Normal brain

## 2014-09-24 MED ORDER — IBUPROFEN 600 MG PO TABS: 600.0000 mg | ORAL_TABLET | Freq: Four times a day (QID) | ORAL | Status: DC | PRN

## 2014-09-24 NOTE — Discharge Instructions (Signed)
You were seen today for multiple complaints. Your workup is largely reassuring. Your chest is tender when I press on it which is somewhat suggestive of a muscular component. All your other workup is reassuring. You should follow-up with her primary physician if symptoms persist or worsen.  Paresthesia Paresthesia is an abnormal burning or prickling sensation. This sensation is generally felt in the hands, arms, legs, or feet. However, it may occur in any part of the body. It is usually not painful. The feeling may be described as:  Tingling or numbness.  "Pins and needles."  Skin crawling.  Buzzing.  Limbs "falling asleep."  Itching. Most people experience temporary (transient) paresthesia at some time in their lives. CAUSES  Paresthesia may occur when you breathe too quickly (hyperventilation). It can also occur without any apparent cause. Commonly, paresthesia occurs when pressure is placed on a nerve. The feeling quickly goes away once the pressure is removed. For some people, however, paresthesia is a long-lasting (chronic) condition caused by an underlying disorder. The underlying disorder may be:  A traumatic, direct injury to nerves. Examples include a:  Broken (fractured) neck.  Fractured skull.  A disorder affecting the brain and spinal cord (central nervous system). Examples include:  Transverse myelitis.  Encephalitis.  Transient ischemic attack.  Multiple sclerosis.  Stroke.  Tumor or blood vessel problems, such as an arteriovenous malformation pressing against the brain or spinal cord.  A condition that damages the peripheral nerves (peripheral neuropathy). Peripheral nerves are not part of the brain and spinal cord. These conditions include:  Diabetes.  Peripheral vascular disease.  Nerve entrapment syndromes, such as carpal tunnel syndrome.  Shingles.  Hypothyroidism.  Vitamin B12 deficiencies.  Alcoholism.  Heavy metal poisoning (lead,  arsenic).  Rheumatoid arthritis.  Systemic lupus erythematosus. DIAGNOSIS  Your caregiver will attempt to find the underlying cause of your paresthesia. Your caregiver may:  Take your medical history.  Perform a physical exam.  Order various lab tests.  Order imaging tests. TREATMENT  Treatment for paresthesia depends on the underlying cause. HOME CARE INSTRUCTIONS  Avoid drinking alcohol.  You may consider massage or acupuncture to help relieve your symptoms.  Keep all follow-up appointments as directed by your caregiver. SEEK IMMEDIATE MEDICAL CARE IF:   You feel weak.  You have trouble walking or moving.  You have problems with speech or vision.  You feel confused.  You cannot control your bladder or bowel movements.  You feel numbness after an injury.  You faint.  Your burning or prickling feeling gets worse when walking.  You have pain, cramps, or dizziness.  You develop a rash. MAKE SURE YOU:  Understand these instructions.  Will watch your condition.  Will get help right away if you are not doing well or get worse. Document Released: 03/23/2002 Document Revised: 06/25/2011 Document Reviewed: 12/22/2010 [employer] Patient Information 2015 [place], [place]. This information is not intended to replace advice given to you by your health care provider. Make sure you discuss any questions you have with your health care provider. Chest Pain (Nonspecific) It is often hard to give a specific diagnosis for the cause of chest pain. There is always a chance that your pain could be related to something serious, such as a heart attack or a blood clot in the lungs. You need to follow up with your health care provider for further evaluation. CAUSES   Heartburn.  Pneumonia or bronchitis.  Anxiety or stress.  Inflammation around your heart (pericarditis) or lung (pleuritis  or pleurisy).  A blood clot in the lung.  A collapsed lung (pneumothorax). It can develop  suddenly on its own (spontaneous pneumothorax) or from trauma to the chest.  Shingles infection (herpes zoster virus). The chest wall is composed of bones, muscles, and cartilage. Any of these can be the source of the pain.  The bones can be bruised by injury.  The muscles or cartilage can be strained by coughing or overwork.  The cartilage can be affected by inflammation and become sore (costochondritis). DIAGNOSIS  Lab tests or other studies may be needed to find the cause of your pain. Your health care provider may have you take a test called an ambulatory electrocardiogram (ECG). An ECG records your heartbeat patterns over a 24-hour period. You may also have other tests, such as:  Transthoracic echocardiogram (TTE). During echocardiography, sound waves are used to evaluate how blood flows through your heart.  Transesophageal echocardiogram (TEE).  Cardiac monitoring. This allows your health care provider to monitor your heart rate and rhythm in real time.  Holter monitor. This is a portable device that records your heartbeat and can help diagnose heart arrhythmias. It allows your health care provider to track your heart activity for several days, if needed.  Stress tests by exercise or by giving medicine that makes the heart beat faster. TREATMENT   Treatment depends on what may be causing your chest pain. Treatment may include:  Acid blockers for heartburn.  Anti-inflammatory medicine.  Pain medicine for inflammatory conditions.  Antibiotics if an infection is present.  You may be advised to change lifestyle habits. This includes stopping smoking and avoiding alcohol, caffeine, and chocolate.  You may be advised to keep your head raised (elevated) when sleeping. This reduces the chance of acid going backward from your stomach into your esophagus. Most of the time, nonspecific chest pain will improve within 2-3 days with rest and mild pain medicine.  HOME CARE INSTRUCTIONS    If antibiotics were prescribed, take them as directed. Finish them even if you start to feel better.  For the next few days, avoid physical activities that bring on chest pain. Continue physical activities as directed.  Do not use any tobacco products, including cigarettes, chewing tobacco, or electronic cigarettes.  Avoid drinking alcohol.  Only take medicine as directed by your health care provider.  Follow your health care provider's suggestions for further testing if your chest pain does not go away.  Keep any follow-up appointments you made. If you do not go to an appointment, you could develop lasting (chronic) problems with pain. If there is any problem keeping an appointment, call to reschedule. SEEK MEDICAL CARE IF:   Your chest pain does not go away, even after treatment.  You have a rash with blisters on your chest.  You have a fever. SEEK IMMEDIATE MEDICAL CARE IF:   You have increased chest pain or pain that spreads to your arm, neck, jaw, back, or abdomen.  You have shortness of breath.  You have an increasing cough, or you cough up blood.  You have severe back or abdominal pain.  You feel nauseous or vomit.  You have severe weakness.  You faint.  You have chills. This is an emergency. Do not wait to see if the pain will go away. Get medical help at once. Call your local emergency services (911 in [place] Do not drive yourself to the hospital. MAKE SURE YOU:   Understand these instructions.  Will watch your condition.  Will get help right away if you are not doing well or get worse. Document Released: 01/10/2005 Document Revised: 04/07/2013 Document Reviewed: 11/06/2007 [facility] Patient Information 2015 [place], [place]. This information is not intended to replace advice given to you by your health care provider. Make sure you discuss any questions you have with your health care provider.

## 2014-09-24 NOTE — ED Notes (Signed)
Patient ambulated to the bathroom independently with steady gait and no trouble.

## 2014-09-24 NOTE — ED Notes (Signed)
Delay in blood draw and med admin due to patient in xray and ct

## 2014-12-04 ENCOUNTER — Emergency Department (HOSPITAL_COMMUNITY)
Admission: EM | Admit: 2014-12-04 | Discharge: 2014-12-04 | Disposition: A | Discharge status: Home / Self Care | Payer: 59 | Attending: Emergency Medicine | Admitting: Emergency Medicine

## 2014-12-04 ENCOUNTER — Encounter (HOSPITAL_COMMUNITY): Payer: Self-pay | Admitting: *Deleted

## 2014-12-04 DIAGNOSIS — Z79899 Other long term (current) drug therapy: Secondary | ICD-10-CM | POA: Insufficient documentation

## 2014-12-04 DIAGNOSIS — Z720 Tobacco use: Secondary | ICD-10-CM | POA: Insufficient documentation

## 2014-12-04 DIAGNOSIS — J4521 Mild intermittent asthma with (acute) exacerbation: Secondary | ICD-10-CM | POA: Insufficient documentation

## 2014-12-04 DIAGNOSIS — R05 Cough: Secondary | ICD-10-CM | POA: Diagnosis present

## 2014-12-04 MED ORDER — AZITHROMYCIN 250 MG PO TABS: 250.0000 mg | ORAL_TABLET | Freq: Every day | ORAL | Status: DC

## 2014-12-04 MED ORDER — PREDNISONE 50 MG PO TABS: 50.0000 mg | ORAL_TABLET | Freq: Every day | ORAL | Status: DC

## 2014-12-04 MED ORDER — DEXAMETHASONE SODIUM PHOSPHATE 10 MG/ML IJ SOLN
10.0000 mg | Freq: Once | INTRAMUSCULAR | Status: AC
  Administered 2014-12-04: 10 mg via INTRAMUSCULAR
  Filled 2014-12-04: qty 1

## 2014-12-04 NOTE — ED Notes (Addendum)
Pt reports cold symptoms over last 3 days. Now c/o sob with asthma exacerbation and productive cough with yellow sputum. Has been using albuterol and OTC cold/cough meds without relief. Audible expiratory wheezing in triage.

## 2014-12-04 NOTE — Discharge Instructions (Signed)
We saw you in the ER for your asthma related complains. We gave you some breathing treatments in the ER, and seems like your symptoms have improved. Please take albuterol as needed every 4 hours. Please take the medications prescribed. Please refrain from smoking or smoke exposure. Please see a primary care doctor in 1 week. Return to the ER if your symptoms worsen increased shortness of breath, chest pains, fainting or near fainting.   Asthma Asthma is a recurring condition in which the airways tighten and narrow. Asthma can make it difficult to breathe. It can cause coughing, wheezing, and shortness of breath. Asthma episodes, also called asthma attacks, range from minor to life-threatening. Asthma cannot be cured, but medicines and lifestyle changes can help control it. CAUSES Asthma is believed to be caused by inherited (genetic) and environmental factors, but its exact cause is unknown. Asthma may be triggered by allergens, lung infections, or irritants in the air. Asthma triggers are different for each person. Common triggers include:   Animal dander.  Dust mites.  Cockroaches.  Pollen from trees or grass.  Mold.  Smoke.  Air pollutants such as dust, household cleaners, hair sprays, aerosol sprays, paint fumes, strong chemicals, or strong odors.  Cold air, weather changes, and winds (which increase molds and pollens in the air).  Strong emotional expressions such as crying or laughing hard.  Stress.  Certain medicines (such as aspirin) or types of drugs (such as beta-blockers).  Sulfites in foods and drinks. Foods and drinks that may contain sulfites include dried fruit, potato chips, and sparkling grape juice.  Infections or inflammatory conditions such as the flu, a cold, or an inflammation of the nasal membranes (rhinitis).  Gastroesophageal reflux disease (GERD).  Exercise or strenuous activity. SYMPTOMS Symptoms may occur immediately after asthma is triggered or  many hours later. Symptoms include:  Wheezing.  Excessive nighttime or early morning coughing.  Frequent or severe coughing with a common cold.  Chest tightness.  Shortness of breath. DIAGNOSIS  The diagnosis of asthma is made by a review of your medical history and a physical exam. Tests may also be performed. These may include:  Lung function studies. These tests show how much air you breathe in and out.  Allergy tests.  Imaging tests such as X-rays. TREATMENT  Asthma cannot be cured, but it can usually be controlled. Treatment involves identifying and avoiding your asthma triggers. It also involves medicines. There are 2 classes of medicine used for asthma treatment:   Controller medicines. These prevent asthma symptoms from occurring. They are usually taken every day.  Reliever or rescue medicines. These quickly relieve asthma symptoms. They are used as needed and provide short-term relief. Your health care provider will help you create an asthma action plan. An asthma action plan is a written plan for managing and treating your asthma attacks. It includes a list of your asthma triggers and how they may be avoided. It also includes information on when medicines should be taken and when their dosage should be changed. An action plan may also involve the use of a device called a peak flow meter. A peak flow meter measures how well the lungs are working. It helps you monitor your condition. HOME CARE INSTRUCTIONS   Take medicines only as directed by your health care provider. Speak with your health care provider if you have questions about how or when to take the medicines.  Use a peak flow meter as directed by your health care provider. Record and  keep track of readings.  Understand and use the action plan to help minimize or stop an asthma attack without needing to seek medical care.  Control your home environment in the following ways to help prevent asthma attacks:  Do not  smoke. Avoid being exposed to secondhand smoke.  Change your heating and air conditioning filter regularly.  Limit your use of fireplaces and wood stoves.  Get rid of pests (such as roaches and mice) and their droppings.  Throw away plants if you see mold on them.  Clean your floors and dust regularly. Use unscented cleaning products.  Try to have someone else vacuum for you regularly. Stay out of rooms while they are being vacuumed and for a short while afterward. If you vacuum, use a dust mask from a hardware store, a double-layered or microfilter vacuum cleaner bag, or a vacuum cleaner with a HEPA filter.  Replace carpet with wood, tile, or vinyl flooring. Carpet can trap dander and dust.  Use allergy-proof pillows, mattress covers, and box spring covers.  Wash bed sheets and blankets every week in hot water and dry them in a dryer.  Use blankets that are made of polyester or cotton.  Clean bathrooms and kitchens with bleach. If possible, have someone repaint the walls in these rooms with mold-resistant paint. Keep out of the rooms that are being cleaned and painted.  Wash hands frequently. SEEK MEDICAL CARE IF:   You have wheezing, shortness of breath, or a cough even if taking medicine to prevent attacks.  The colored mucus you cough up (sputum) is thicker than usual.  Your sputum changes from clear or white to yellow, green, gray, or bloody.  You have any problems that may be related to the medicines you are taking (such as a rash, itching, swelling, or trouble breathing).  You are using a reliever medicine more than 2-3 times per week.  Your peak flow is still at 50-79% of your personal best after following your action plan for 1 hour.  You have a fever. SEEK IMMEDIATE MEDICAL CARE IF:   You seem to be getting worse and are unresponsive to treatment during an asthma attack.  You are short of breath even at rest.  You get short of breath when doing very little  physical activity.  You have difficulty eating, drinking, or talking due to asthma symptoms.  You develop chest pain.  You develop a fast heartbeat.  You have a bluish color to your lips or fingernails.  You are light-headed, dizzy, or faint.  Your peak flow is less than 50% of your personal best. MAKE SURE YOU:   Understand these instructions.  Will watch your condition.  Will get help right away if you are not doing well or get worse. Document Released: 04/02/2005 Document Revised: 08/17/2013 Document Reviewed: 10/30/2012 [facility] Patient Information 2015 [place], [place]. This information is not intended to replace advice given to you by your health care provider. Make sure you discuss any questions you have with your health care provider.

## 2014-12-04 NOTE — ED Provider Notes (Signed)
CSN: [id number]     Arrival date & time 12/04/14  1705 History   First MD Initiated Contact with Patient 12/04/14 1717     Chief Complaint  Patient presents with  . Cough  . Asthma     (Consider location/radiation/quality/duration/timing/severity/associated sxs/prior Treatment) HPI Comments: SUBJECTIVE:  Samantha Allen is a 19 y.o. female seen urgently with exacerbation of asthma for 2 days. Wheezing is described as mild-moderate. Associated symptoms:congestion, productive cough and cough described as productive. Current asthma medications: Proventil MDI (or generic equivalent), using meds compliantly. Patient smokes cigarettes. Pt has no hx of PE, DVT and denies any exogenous estrogen use, long distance travels or surgery in the past 6 weeks, active cancer, recent immobilization.   OBJECTIVE:  The patient appears alert, well appearing, and in no distress. ENT: ENT exam normal, no neck nodes or sinus tenderness CHEST:clear to auscultation, no wheezes, rales or rhonchi, symmetric air entry, no tachypnea, retractions or cyanosis    Patient is a 19 y.o. female presenting with cough and asthma. The history is provided by the patient.  Cough Associated symptoms: shortness of breath and wheezing   Associated symptoms: no chest pain and no headaches   Asthma Associated symptoms include shortness of breath. Pertinent negatives include no chest pain, no abdominal pain and no headaches.    Past Medical History  Diagnosis Date  . Pain from implanted hardware 11/2011    retained syndesmosis screw right ankle  . Asthma    Past Surgical History  Procedure Laterality Date  . Cholecystectomy    . Tonsillectomy    . Orif ankle fracture  07/2011    right  . Hardware removal  11/22/2011    Procedure: HARDWARE REMOVAL;  Surgeon: Toni Arthurs, MD;  Location: [facility];  Service: Orthopedics;  Laterality: Right;   Family History  Problem Relation Age of Onset  . Anesthesia problems  Mother     post-op N/V  . Hypertension Mother   . Diabetes Father    Social History  Substance Use Topics  . Smoking status: Current Every Day Smoker  . Smokeless tobacco: Never Used  . Alcohol Use: No   OB History    No data available     Review of Systems  Constitutional: Negative for activity change.  Respiratory: Positive for cough, shortness of breath and wheezing.   Cardiovascular: Negative for chest pain.  Gastrointestinal: Negative for nausea, vomiting and abdominal pain.  Genitourinary: Negative for dysuria.  Musculoskeletal: Negative for neck pain.  Neurological: Negative for headaches.      Allergies  Tuna  Home Medications   Prior to Admission medications   Medication Sig Start Date End Date Taking? Authorizing Provider  acetaminophen-codeine 120-12 MG/5ML solution Take 10 mLs by mouth every 4 (four) hours as needed for moderate pain (And cough). Patient not taking: Reported on 09/23/2014 09/17/14   Charlestine Night, PA-C  albuterol (PROVENTIL HFA;VENTOLIN HFA) 108 (90 BASE) MCG/ACT inhaler Inhale 1-2 puffs into the lungs every 6 (six) hours as needed for wheezing or shortness of breath. 08/19/14   Marlon Pel, PA-C  albuterol (PROVENTIL) (5 MG/ML) 0.5% nebulizer solution Take 0.5 mLs (2.5 mg total) by nebulization every 6 (six) hours as needed for wheezing or shortness of breath. 08/19/14   Tiffany Neva Seat, PA-C  azithromycin (ZITHROMAX) 250 MG tablet Take 1 tablet (250 mg total) by mouth daily. Take first 2 tablets together, then 1 every day until finished. 12/04/14   Derwood Kaplan, MD  Guaifenesin 1200 MG  TB12 Take 1 tablet (1,200 mg total) by mouth 2 (two) times daily. Patient not taking: Reported on 09/23/2014 09/17/14   Charlestine Night, PA-C  ibuprofen (ADVIL,MOTRIN) 200 MG tablet Take 200 mg by mouth every 6 (six) hours as needed. Pain/ headache    Historical Provider, MD  ibuprofen (ADVIL,MOTRIN) 600 MG tablet Take 1 tablet (600 mg total) by mouth every 6  (six) hours as needed. 09/24/14   Shon Baton, MD  predniSONE (DELTASONE) 50 MG tablet Take 1 tablet (50 mg total) by mouth daily. 12/04/14   Farris Geiman, MD   BP 136/81 mmHg  Pulse 101  Temp(Src) 98.3 F (36.8 C) (Oral)  Resp 18  SpO2 97%  LMP  (LMP Unknown) Physical Exam  Constitutional: She is oriented to person, place, and time. She appears well-developed and well-nourished.  HENT:  Head: Normocephalic and atraumatic.  Eyes: EOM are normal. Pupils are equal, round, and reactive to light.  Neck: Neck supple.  Cardiovascular: Normal rate, regular rhythm and normal heart sounds.   No murmur heard. Pulmonary/Chest: Effort normal. No respiratory distress. She has no rales.  Mild wheezing  Abdominal: Soft. She exhibits no distension. There is no tenderness. There is no rebound and no guarding.  Musculoskeletal: She exhibits no edema or tenderness.  Neurological: She is alert and oriented to person, place, and time.  Skin: Skin is warm and dry.  Nursing note and vitals reviewed.   ED Course  Procedures (including critical care time) Labs Review Labs Reviewed - No data to display     EKG Interpretation None      MDM   Final diagnoses:  Acute asthma exacerbation, mild intermittent    ASSESSMENT:  Asthma - acute exacerbation  PLAN:  oximetry on room air was 97%  RX per orders - Use bronchodilator MDI 2 puff q4h prn, steroid MDI regularly to prevent asthma and oral steroid taper.  Additional suggestions to patient: counseled to quit smoking and return if not improved or if worse.  Pt comes in with cc of wheezing and dib. She is a smoker, with a new cough. Mild wheezing currently. Pt is morbidly obese, but has no PE risk factors. Strict return precautions if she is not getting better discussed. Will give prednisone and antibiotics.   The patient was counseled on the dangers of tobacco use, and was advised to quit.  Reviewed strategies to maximize success,  including removing cigarettes and smoking materials from environment, stress management and substitution of other forms of reinforcement. Discussion 3-5 min    Derwood Kaplan, MD 12/04/14 [phone]

## 2015-02-03 ENCOUNTER — Ambulatory Visit (INDEPENDENT_AMBULATORY_CARE_PROVIDER_SITE_OTHER): Payer: 59 | Admitting: Family Medicine

## 2015-02-03 VITALS — BP 140/90 | HR 91 | Temp 102.8°F | Resp 22 | Ht 65.0 in | Wt 289.0 lb

## 2015-02-03 DIAGNOSIS — R509 Fever, unspecified: Secondary | ICD-10-CM | POA: Diagnosis not present

## 2015-02-03 DIAGNOSIS — J029 Acute pharyngitis, unspecified: Secondary | ICD-10-CM

## 2015-02-03 DIAGNOSIS — J020 Streptococcal pharyngitis: Secondary | ICD-10-CM | POA: Diagnosis not present

## 2015-02-03 DIAGNOSIS — R51 Headache: Secondary | ICD-10-CM

## 2015-02-03 DIAGNOSIS — R519 Headache, unspecified: Secondary | ICD-10-CM

## 2015-02-03 LAB — POCT INFLUENZA A/B
Influenza A, POC: NEGATIVE
Influenza B, POC: NEGATIVE

## 2015-02-03 LAB — POCT RAPID STREP A (OFFICE): Rapid Strep A Screen: POSITIVE — AB

## 2015-02-03 MED ORDER — AMOXICILLIN 500 MG PO CAPS: 500.0000 mg | ORAL_CAPSULE | Freq: Three times a day (TID) | ORAL | Status: DC

## 2015-02-03 MED ORDER — ACETAMINOPHEN 325 MG PO TABS
1000.0000 mg | ORAL_TABLET | Freq: Once | ORAL | Status: AC
  Administered 2015-02-03: 1000 mg via ORAL

## 2015-02-03 NOTE — Patient Instructions (Addendum)
Start amoxicillin - continue for 10 days for strep throat.  Tylenol or advil as needed for body aches and fever, drink plenty of fluids and rest.  Return to the clinic or go to the nearest emergency room if any of your symptoms worsen or new symptoms occur.  Strep Throat Strep throat is a bacterial infection of the throat. Your health care provider may call the infection tonsillitis or pharyngitis, depending on whether there is swelling in the tonsils or at the back of the throat. Strep throat is most common during the cold months of the year in children who are 235-21 years of age, but it can happen during any season in people of any age. This infection is spread from person to person (contagious) through coughing, sneezing, or close contact. CAUSES Strep throat is caused by the bacteria called Streptococcus pyogenes. RISK FACTORS This condition is more likely to develop in:  People who spend time in crowded places where the infection can spread easily.  People who have close contact with someone who has strep throat. SYMPTOMS Symptoms of this condition include:  Fever or chills.   Redness, swelling, or pain in the tonsils or throat.  Pain or difficulty when swallowing.  White or yellow spots on the tonsils or throat.  Swollen, tender glands in the neck or under the jaw.  Red rash all over the body (rare). DIAGNOSIS This condition is diagnosed by performing a rapid strep test or by taking a swab of your throat (throat culture test). Results from a rapid strep test are usually ready in a few minutes, but throat culture test results are available after one or two days. TREATMENT This condition is treated with antibiotic medicine. HOME CARE INSTRUCTIONS Medicines  Take over-the-counter and prescription medicines only as told by your health care provider.  Take your antibiotic as told by your health care provider. Do not stop taking the antibiotic even if you start to feel  better.  Have family members who also have a sore throat or fever tested for strep throat. They may need antibiotics if they have the strep infection. Eating and Drinking  Do not share food, drinking cups, or personal items that could cause the infection to spread to other people.  If swallowing is difficult, try eating soft foods until your sore throat feels better.  Drink enough fluid to keep your urine clear or pale yellow. General Instructions  Gargle with a salt-water mixture 3-4 times per day or as needed. To make a salt-water mixture, completely dissolve -1 tsp of salt in 1 cup of warm water.  Make sure that all household members wash their hands well.  Get plenty of rest.  Stay home from school or work until you have been taking antibiotics for 24 hours.  Keep all follow-up visits as told by your health care provider. This is important. SEEK MEDICAL CARE IF:  The glands in your neck continue to get bigger.  You develop a rash, cough, or earache.  You cough up a thick liquid that is green, yellow-brown, or bloody.  You have pain or discomfort that does not get better with medicine.  Your problems seem to be getting worse rather than better.  You have a fever. SEEK IMMEDIATE MEDICAL CARE IF:  You have new symptoms, such as vomiting, severe headache, stiff or painful neck, chest pain, or shortness of breath.  You have severe throat pain, drooling, or changes in your voice.  You have swelling of the neck, or  the skin on the neck becomes red and tender.  You have signs of dehydration, such as fatigue, dry mouth, and decreased urination.  You become increasingly sleepy, or you cannot wake up completely.  Your joints become red or painful.   This information is not intended to replace advice given to you by your health care provider. Make sure you discuss any questions you have with your health care provider.   Document Released: 03/30/2000 Document Revised:  12/22/2014 Document Reviewed: 07/26/2014 [employer] Interactive Patient Education [employer]
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
## 2015-04-01 ENCOUNTER — Ambulatory Visit (INDEPENDENT_AMBULATORY_CARE_PROVIDER_SITE_OTHER): Payer: 59 | Admitting: Physician Assistant

## 2015-04-01 VITALS — BP 106/82 | HR 110 | Temp 98.5°F | Resp 18 | Ht 64.0 in | Wt 293.0 lb

## 2015-04-01 DIAGNOSIS — R1030 Lower abdominal pain, unspecified: Secondary | ICD-10-CM | POA: Diagnosis not present

## 2015-04-01 DIAGNOSIS — N939 Abnormal uterine and vaginal bleeding, unspecified: Secondary | ICD-10-CM

## 2015-04-01 LAB — POCT URINALYSIS DIP (MANUAL ENTRY)
Bilirubin, UA: NEGATIVE
Glucose, UA: NEGATIVE
Ketones, POC UA: NEGATIVE
Leukocytes, UA: NEGATIVE
Nitrite, UA: NEGATIVE
Protein Ur, POC: NEGATIVE
Spec Grav, UA: 1.015
Urobilinogen, UA: 1
pH, UA: 7

## 2015-04-01 LAB — COMPREHENSIVE METABOLIC PANEL
ALT: 14 U/L (ref 6–29)
AST: 17 U/L (ref 10–30)
Albumin: 3.4 g/dL — ABNORMAL LOW (ref 3.6–5.1)
Alkaline Phosphatase: 87 U/L (ref 33–115)
BUN: 8 mg/dL (ref 7–25)
CO2: 27 mmol/L (ref 20–31)
Calcium: 8.8 mg/dL (ref 8.6–10.2)
Chloride: 102 mmol/L (ref 98–110)
Creat: 0.69 mg/dL (ref 0.50–1.10)
Glucose, Bld: 92 mg/dL (ref 65–99)
Potassium: 4.2 mmol/L (ref 3.5–5.3)
Sodium: 137 mmol/L (ref 135–146)
Total Bilirubin: 0.3 mg/dL (ref 0.2–1.2)
Total Protein: 7.5 g/dL (ref 6.1–8.1)

## 2015-04-01 LAB — CBC
HCT: 31.1 % — ABNORMAL LOW (ref 36.0–46.0)
Hemoglobin: 9.7 g/dL — ABNORMAL LOW (ref 12.0–15.0)
MCH: 22.2 pg — ABNORMAL LOW (ref 26.0–34.0)
MCHC: 31.2 g/dL (ref 30.0–36.0)
MCV: 71.3 fL — ABNORMAL LOW (ref 78.0–100.0)
MPV: 8.9 fL (ref 8.6–12.4)
Platelets: 401 10*3/uL — ABNORMAL HIGH (ref 150–400)
RBC: 4.36 MIL/uL (ref 3.87–5.11)
RDW: 17.1 % — ABNORMAL HIGH (ref 11.5–15.5)
WBC: 8.2 10*3/uL (ref 4.0–10.5)

## 2015-04-01 LAB — POC MICROSCOPIC URINALYSIS (UMFC): Mucus: ABSENT

## 2015-04-01 LAB — TSH: TSH: 1.539 u[IU]/mL (ref 0.350–4.500)

## 2015-04-01 LAB — POCT URINE PREGNANCY: Preg Test, Ur: NEGATIVE

## 2015-04-01 MED ORDER — NORETHIN ACE-ETH ESTRAD-FE 1.5-30 MG-MCG PO TABS: 1.0000 | ORAL_TABLET | Freq: Every day | ORAL | Status: DC

## 2015-04-01 NOTE — Patient Instructions (Addendum)
I will call you with results of your lab tests. You will get a phone call to make appt for ultrasound. Start on birth control daily - start Sunday if you want to have your periods during the week. Work on diet and weight loss like we discussed. We will discuss further once we get all results.

## 2015-04-01 NOTE — Progress Notes (Signed)
[facility] [address], [place] [id number] [phone]  Date:  04/01/2015   Name:  Samantha CotaBriana Allen   DOB:  April 06, 1996   MRN:  [id number]  PCP:  Cheryln ManlyANDERSON,JAMES C, MD    Chief Complaint: Abdominal Pain; Menstrual Problem; and Back Pain   History of Present Illness:  This is a 19 y.o. female with PMH obesity who is presenting with abdominal pain, back pain and irregular menstrual cycles.  She started her period 5 days ago. Today she woke with abdominal pain and back pain. It is normal for her to have abdominal and back pain with her periods but she states it's usually not this bad. Abd pain "feels like my insides are falling out". Back pain feels like "I'm being ripped apart". She states she is having heavy bleeding with clots. Denies dysuria, urinary freq, n/v, fever/chills. She has noticed that for the past 1 year, periods have been very irregular and periods are more painful. She will sometimes skip months. Other times she will have prolonged bleeding for 1-3 months. She reports she went and saw a GYN about a month ago and had blood work and an ultrasound but she has no idea who she saw or where she went. She never heard back about her results.  Thyroid disorders run in her family.  She has had a change in mood over past year - she states she is more irritable. She has also been more fatigued the past year. She has a few hairs on her chest, otherwise no other excess hair. She is sexually active with her girlfriend. She has never been on ocp. She reports several years ago she had an ultrasound and was told her had a cyst on one of her ovaries. She took testosterone 2 years ago in college, but hasn't taken any exogenous hormones since.  Review of Systems:  Review of Systems See HPI  Patient Active Problem List   Diagnosis Date Noted  . BMI 45.0-49.9, adult (HCC) 08/10/2013    Prior to Admission medications   Medication Sig Start Date End Date Taking?  Authorizing Provider  albuterol (PROVENTIL HFA;VENTOLIN HFA) 108 (90 BASE) MCG/ACT inhaler Inhale 1-2 puffs into the lungs every 6 (six) hours as needed for wheezing or shortness of breath. 08/19/14  Yes Tiffany Neva SeatGreene, PA-C  albuterol (PROVENTIL) (5 MG/ML) 0.5% nebulizer solution Take 0.5 mLs (2.5 mg total) by nebulization every 6 (six) hours as needed for wheezing or shortness of breath. 08/19/14  Yes Tiffany Neva SeatGreene, PA-C  ibuprofen (ADVIL,MOTRIN) 200 MG tablet Take 200 mg by mouth every 6 (six) hours as needed. Pain/ headache   Yes Historical Provider, MD  ibuprofen (ADVIL,MOTRIN) 600 MG tablet Take 1 tablet (600 mg total) by mouth every 6 (six) hours as needed. Patient not taking: Reported on 04/01/2015 09/24/14   Shon Batonourtney F Horton, MD    Allergies  Allergen Reactions  . Prescott Gumuna [Fish Allergy] Anaphylaxis    Past Surgical History  Procedure Laterality Date  . Cholecystectomy    . Tonsillectomy    . Orif ankle fracture  07/2011    right  . Hardware removal  11/22/2011    Procedure: HARDWARE REMOVAL;  Surgeon: Toni ArthursJohn Hewitt, MD;  Location: [facility];  Service: Orthopedics;  Laterality: Right;    Social History  Substance Use Topics  . Smoking status: Current Every Day Smoker  . Smokeless tobacco: Never Used  . Alcohol Use: No    Family History  Problem Relation  Age of Onset  . Anesthesia problems Mother     post-op N/V  . Hypertension Mother   . Diabetes Father     Medication list has been reviewed and updated.  Physical Examination:  Physical Exam  Constitutional: She is oriented to person, place, and time. She appears well-developed and well-nourished. No distress.  HENT:  Head: Normocephalic and atraumatic.  Right Ear: Hearing normal.  Left Ear: Hearing normal.  Nose: Nose normal.  Eyes: Conjunctivae and lids are normal. Right eye exhibits no discharge. Left eye exhibits no discharge. No scleral icterus.  Cardiovascular: Normal rate, regular rhythm, normal  heart sounds and normal pulses.   No murmur heard. Pulmonary/Chest: Effort normal and breath sounds normal. No respiratory distress. She has no wheezes. She has no rhonchi. She has no rales.  Abdominal: Soft. Normal appearance. There is tenderness (mild) in the left lower quadrant. There is no CVA tenderness.  Obese  Genitourinary: There is no lesion on the right labia. There is no lesion on the left labia. Uterus is not tender. Cervix exhibits no motion tenderness and no discharge. Right adnexum displays no tenderness. Left adnexum displays no tenderness. There is bleeding in the vagina. No tenderness in the vagina. No vaginal discharge found.  Obesity limited exam. As far as I could tell, no adnexal fullness or enlargement of uterus  Musculoskeletal: Normal range of motion.  Neurological: She is alert and oriented to person, place, and time.  Skin: Skin is warm, dry and intact. No lesion and no rash noted.  Psychiatric: She has a normal mood and affect. Her speech is normal and behavior is normal. Thought content normal.   BP 106/82 mmHg  Pulse 110  Temp(Src) 98.5 F (36.9 C)  Resp 18  Ht  (1.626 m)  Wt 293 lb (132.904 kg)  BMI 50.27 kg/m2  SpO2 98%  LMP 04/01/2015  Results for orders placed or performed in visit on 04/01/15  POCT urine pregnancy  Result Value Ref Range   Preg Test, Ur Negative Negative  POCT urinalysis dipstick  Result Value Ref Range   Color, UA other (A) yellow   Clarity, UA cloudy (A) clear   Glucose, UA negative negative   Bilirubin, UA negative negative   Ketones, POC UA negative negative   Spec Grav, UA 1.015    Blood, UA large (A) negative   pH, UA 7.0    Protein Ur, POC negative negative   Urobilinogen, UA 1.0    Nitrite, UA Negative Negative   Leukocytes, UA Negative Negative  POCT Microscopic Urinalysis ([facility])  Result Value Ref Range   WBC,UR,HPF,POC None None WBC/hpf   RBC,UR,HPF,POC Too numerous to count  (A) None RBC/hpf   Bacteria  None None, Too numerous to count   Mucus Absent Absent   Epithelial Cells, UR Per Microscopy Few (A) None, Too numerous to count cells/hpf    Assessment and Plan:  1. Abnormal uterine bleeding (AUB) 2. Lower abdominal pain 3. 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 Abnormal cycles and heavy bleeding - diff dx includes anatomic abnormality vs PCOS vs lab abnormality like STD/thyroid. Labs below pending. Referred for transvaginal/complete pelvic u/s. She opted to start OCP today. Junel sent to pharmacy. We discussed the importance of weight loss - we discussed diet and exercise at length. Will follow up once everything resulted. - CBC - Comprehensive metabolic panel - TSH - GC/Chlamydia Probe Amp - Testosterone, Free, Total, SHBG - FSH/LH -  US Transvaginal Non-OB; Future - Trichomonas vaginalis, RNA - norethindrone-ethinyl estradiol-iron (MICROGESTIN FE,GILDESS FE,LOESTRIN FE) 1.5-30 MG-MCG tablet; Take 1 tablet by mouth daily.  Dispense: 1 Package; Refill: 12 - POCT urine pregnancy - POCT urinalysis dipstick - POCT Microscopic Urinalysis ([facility]) - Hemoglobin A1c   [employer] [employer]. Dyke Brackett, MHS [facility]  04/01/2015

## 2015-04-02 LAB — GC/CHLAMYDIA PROBE AMP
CT Probe RNA: NOT DETECTED
GC Probe RNA: NOT DETECTED

## 2015-04-02 LAB — HEMOGLOBIN A1C
Hgb A1c MFr Bld: 5.9 % — ABNORMAL HIGH (ref <5.7)
Mean Plasma Glucose: 123 mg/dL — ABNORMAL HIGH (ref <117)

## 2015-04-02 LAB — FSH/LH
FSH: 7.6 m[IU]/mL
LH: 2.5 m[IU]/mL

## 2015-04-04 LAB — TESTOSTERONE, FREE, TOTAL, SHBG
Sex Hormone Binding: 48 nmol/L (ref 17–124)
Testosterone, Free: 4.8 pg/mL (ref 0.6–6.8)
Testosterone-% Free: 1.4 % (ref 0.4–2.4)
Testosterone: 34 ng/dL (ref 10–70)

## 2015-04-04 LAB — TRICHOMONAS VAGINALIS, PROBE AMP: T vaginalis RNA: NEGATIVE

## 2015-04-05 ENCOUNTER — Other Ambulatory Visit: Payer: Self-pay | Admitting: Physician Assistant

## 2015-04-05 DIAGNOSIS — N939 Abnormal uterine and vaginal bleeding, unspecified: Secondary | ICD-10-CM

## 2015-04-07 ENCOUNTER — Encounter: Payer: Self-pay | Admitting: Physician Assistant

## 2015-04-07 DIAGNOSIS — R7303 Prediabetes: Secondary | ICD-10-CM | POA: Insufficient documentation

## 2015-04-07 DIAGNOSIS — D649 Anemia, unspecified: Secondary | ICD-10-CM | POA: Insufficient documentation

## 2015-04-14 ENCOUNTER — Telehealth: Payer: Self-pay | Admitting: Physician Assistant

## 2015-04-14 NOTE — Telephone Encounter (Signed)
[facility] has apparently called pt several times to schedule for ultrasound -- called pt and lmom. Left her the number for GI so she can call and schedule.

## 2015-04-19 ENCOUNTER — Other Ambulatory Visit: Payer: 59

## 2015-04-20 ENCOUNTER — Other Ambulatory Visit: Payer: 59

## 2015-04-21 ENCOUNTER — Ambulatory Visit
Admission: RE | Admit: 2015-04-21 | Discharge: 2015-04-21 | Disposition: A | Discharge status: Home / Self Care | Payer: 59 | Source: Ambulatory Visit | Attending: Physician Assistant | Admitting: Physician Assistant

## 2015-04-21 DIAGNOSIS — N939 Abnormal uterine and vaginal bleeding, unspecified: Secondary | ICD-10-CM

## 2015-04-21 IMAGING — US US TRANSVAGINAL NON-OB
1 series · 13 of 25 positions shown · non-contrast
Comparison: None

CLINICAL DATA: Abnormal uterine bleeding.

EXAM:
TRANSABDOMINAL AND TRANSVAGINAL ULTRASOUND OF PELVIS
TECHNIQUE: Both transabdominal and transvaginal ultrasound examinations of the
pelvis were performed. Transabdominal technique was performed for
global imaging of the pelvis including uterus, ovaries, adnexal
regions, and pelvic cul-de-sac. It was necessary to proceed with
endovaginal exam following the transabdominal exam to visualize the
uterus and ovaries.

[Series 1: us transvaginal non-ob · 0.13mm/px · 13 of 38 slices shown]
[im 1/38]
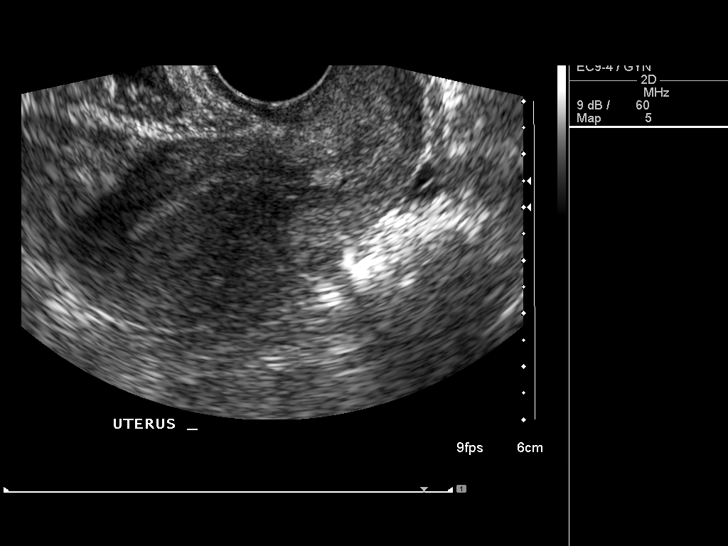
[im 4/38]
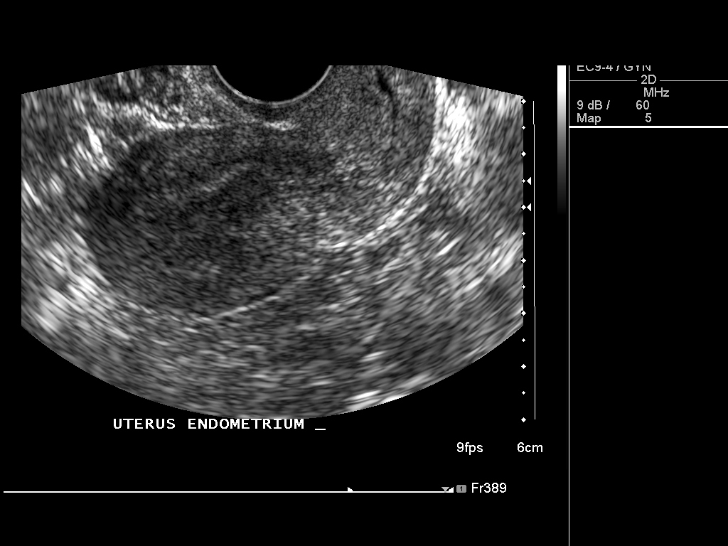
[im 7/38]
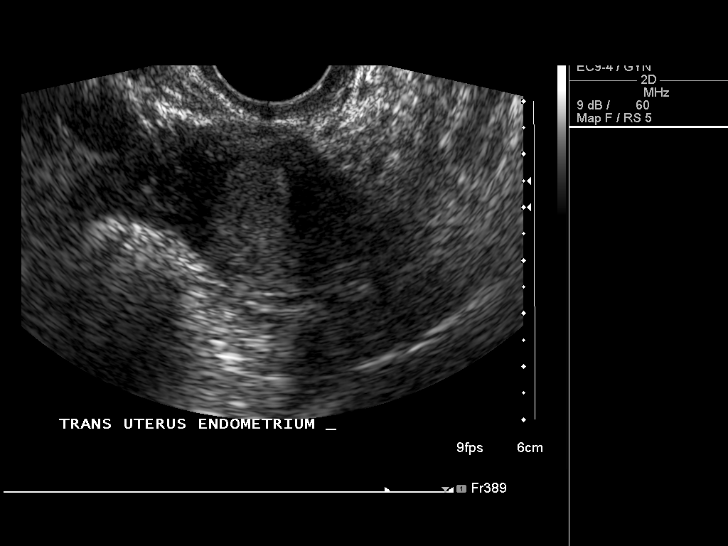
[im 10/38]
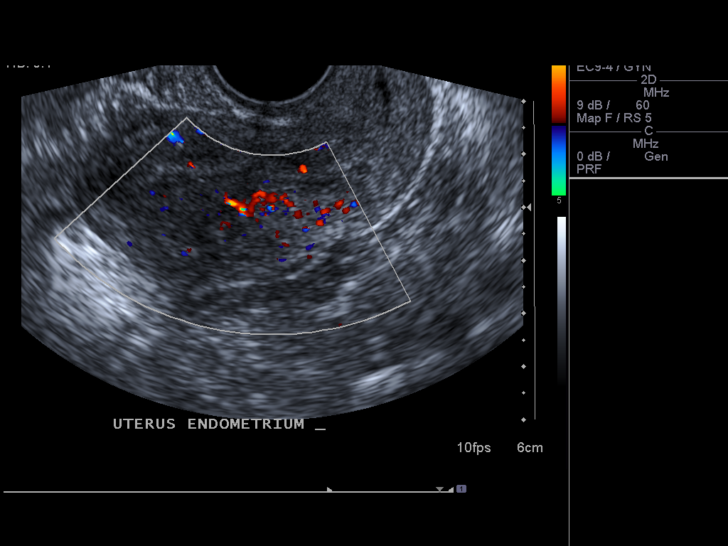
[im 13/38]
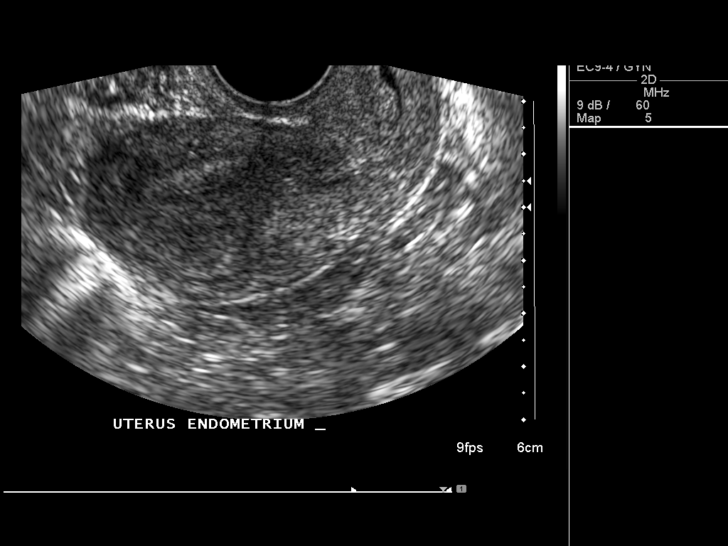
[im 16/38]
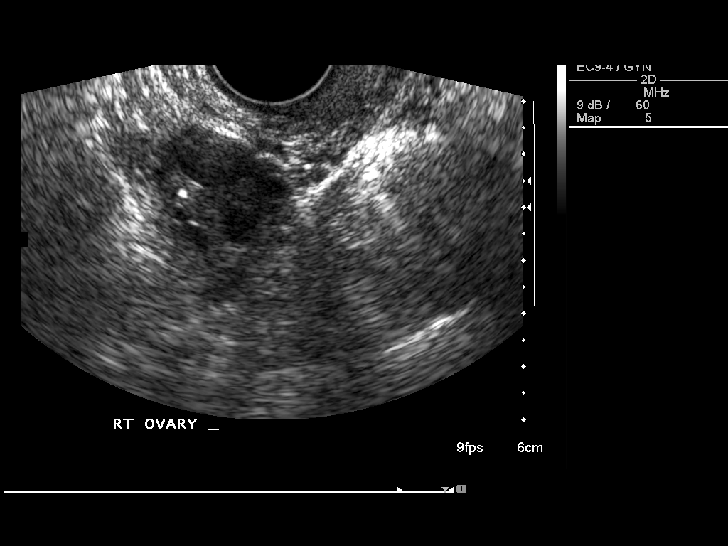
[im 19/38]
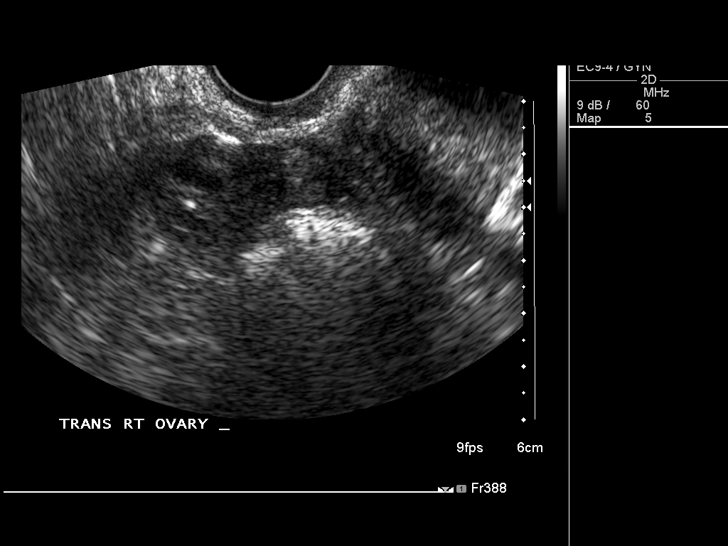
[im 22/38]
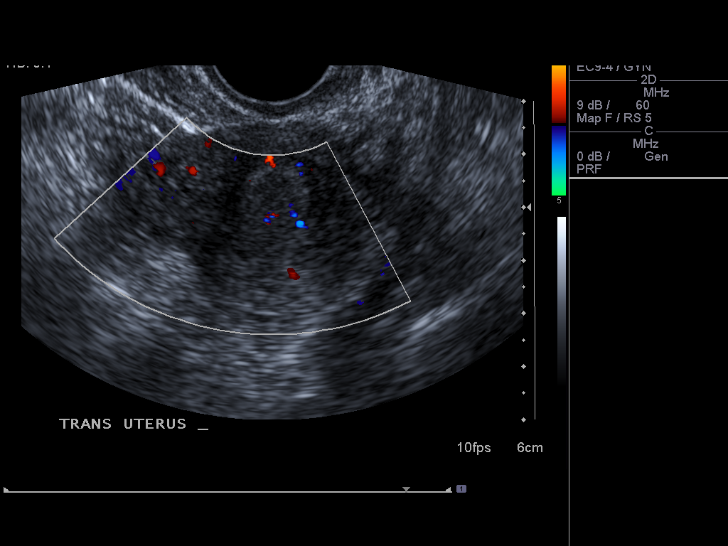
[im 25/38]
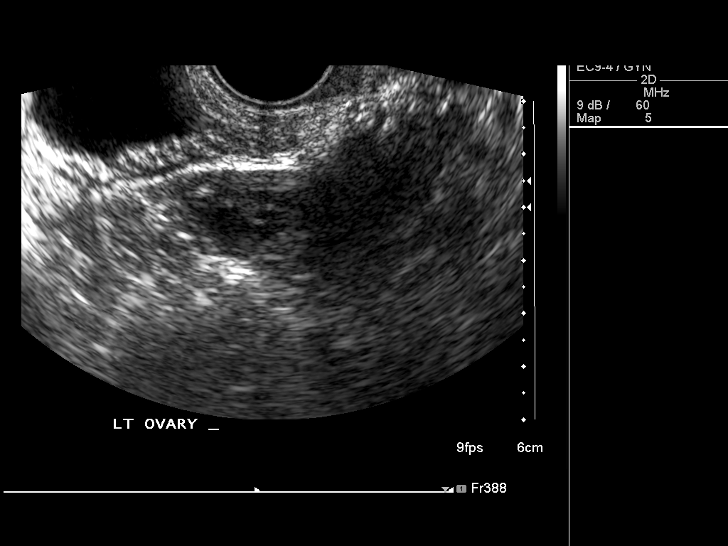
[im 28/38]
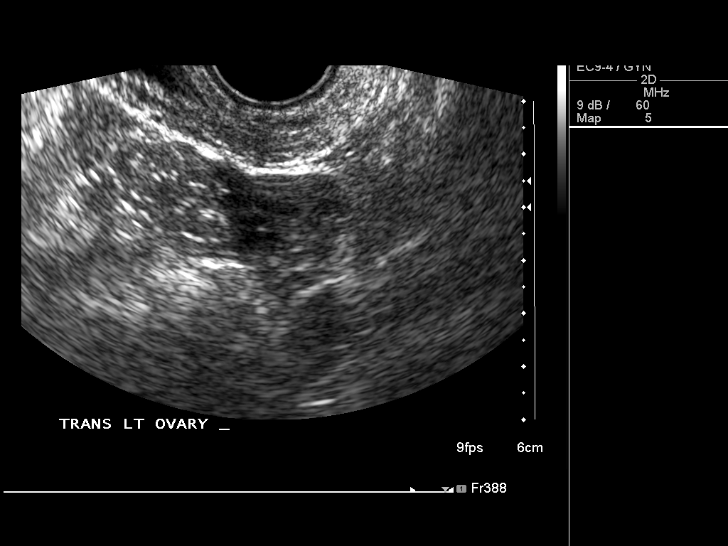
[im 31/38]
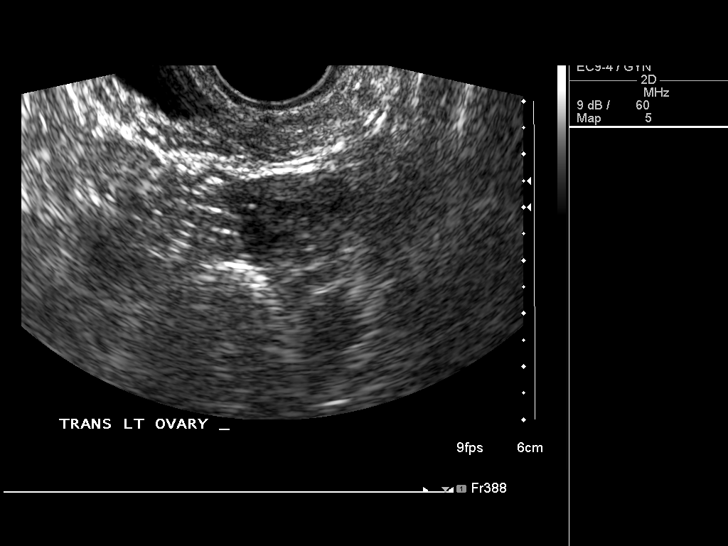
[im 34/38]
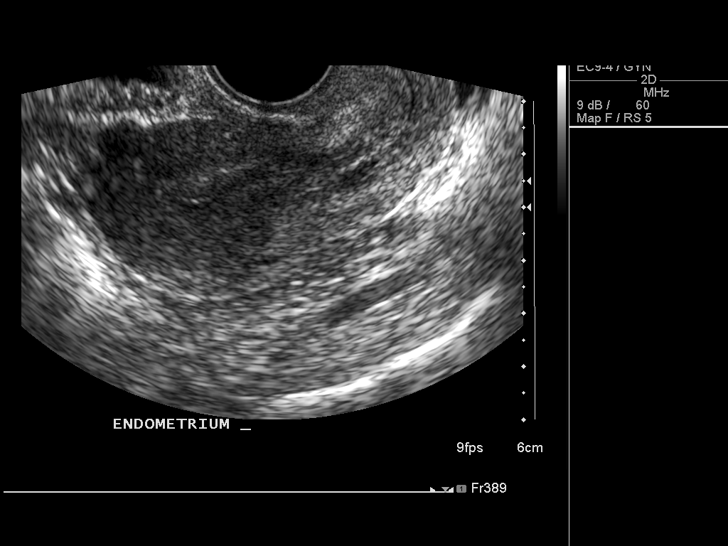
[im 38/38]
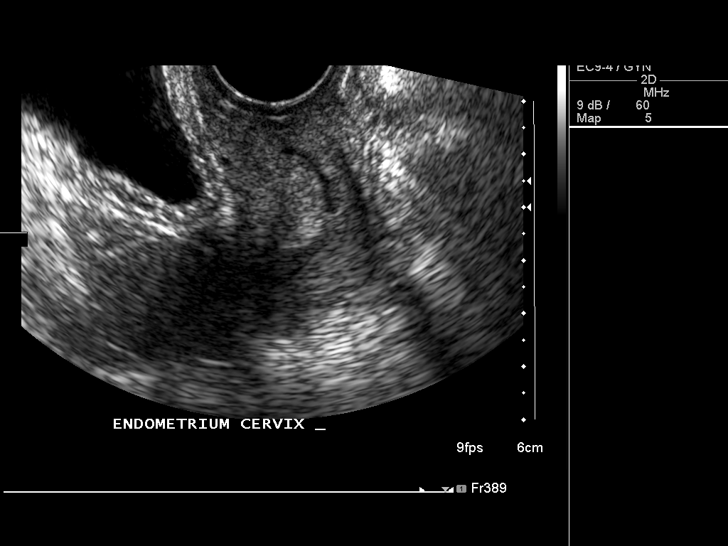

[13 of 25 positions shown; findings below may reference images not displayed]

FINDINGS: Uterus

Measurements: 7.6 x 3.7 x 4.5 cm. See below. Tiny amount of fluid
noted in the cervix .

Endometrium

Thickness: 13 mm. 1.6 cm by 1.3 cm endometrial versus submucosal
focal lesion in the mid portion of the uterus. This could represent
a true endometrial lesion or submucosal fibroid .

Right ovary

Measurements: 3.7 x 2.7 x 2.87. Normal appearance/no adnexal mass.

Left ovary

Measurements: 3.3 x 1.7 x 2.5 cm. Normal appearance/no adnexal mass.

Other findings

No free pelvic fluid .
IMPRESSION: 1.6 x 1.3 cm endometrial versus submucosal focal lesion in the
midportion of the uterus. This could represent a true endometrial
lesion or submucosal fibroid. A focal endometrial lesion
issuspected. Consider sonohysterogram forfurther evaluation, prior
to hysteroscopy or endometrial biopsy.

## 2015-04-21 IMAGING — US US TRANSVAGINAL NON-OB
1 series · 13 of 25 positions shown · non-contrast
Comparison: None

CLINICAL DATA: Abnormal uterine bleeding.

EXAM:
TRANSABDOMINAL AND TRANSVAGINAL ULTRASOUND OF PELVIS
TECHNIQUE: Both transabdominal and transvaginal ultrasound examinations of the
pelvis were performed. Transabdominal technique was performed for
global imaging of the pelvis including uterus, ovaries, adnexal
regions, and pelvic cul-de-sac. It was necessary to proceed with
endovaginal exam following the transabdominal exam to visualize the
uterus and ovaries.

[Series 1: us transvaginal non-ob · 0.30mm/px · 13 of 90 slices shown]
[im 1/90]
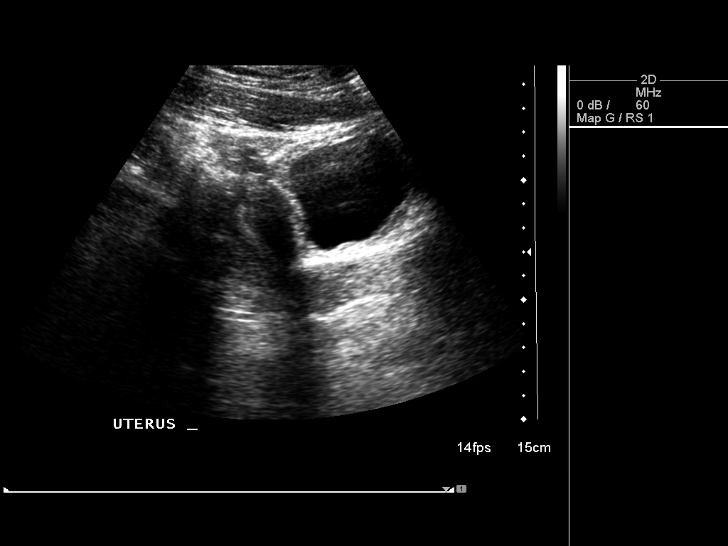
[im 8/90]
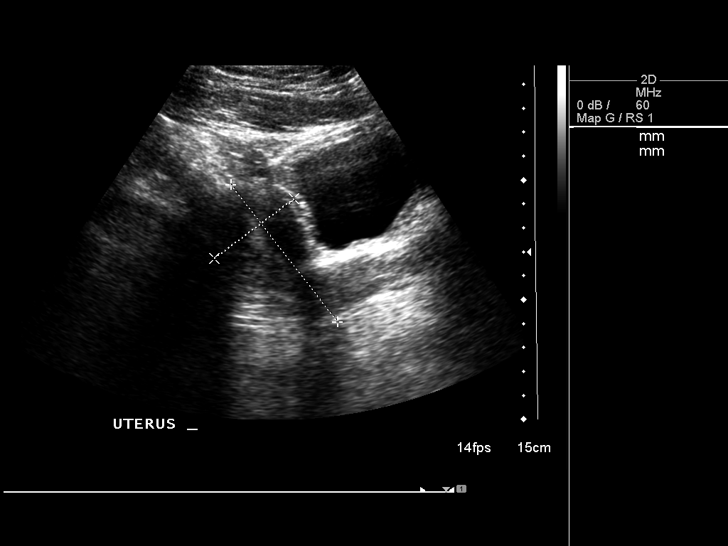
[im 15/90]
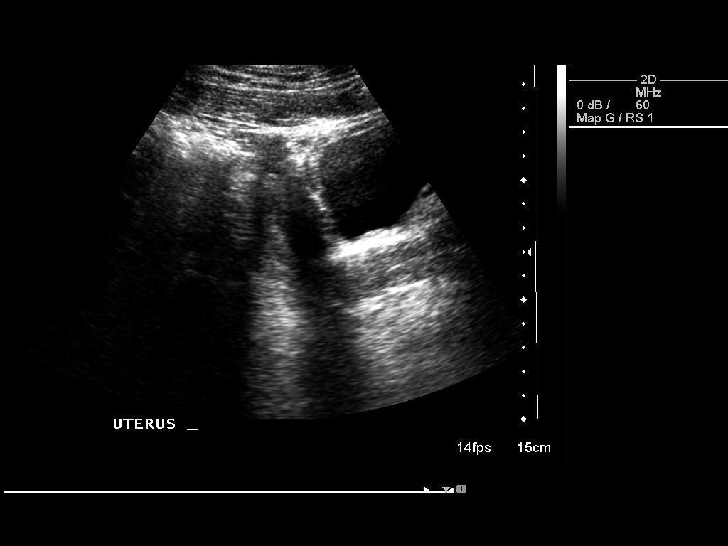
[im 23/90]
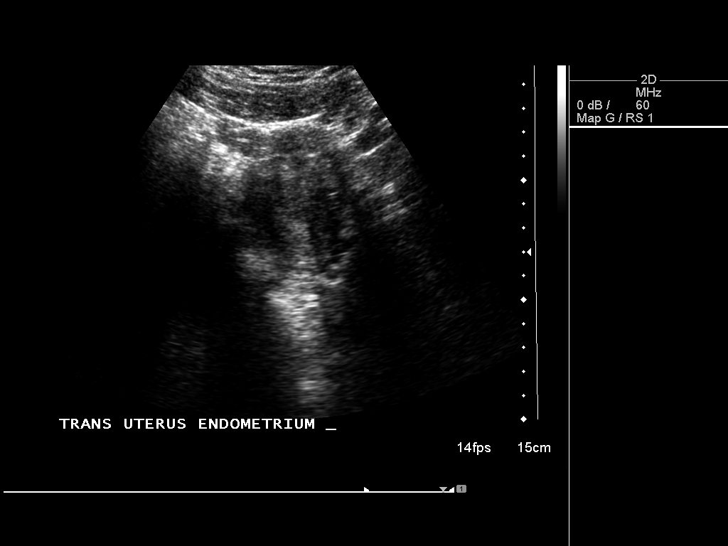
[im 30/90]
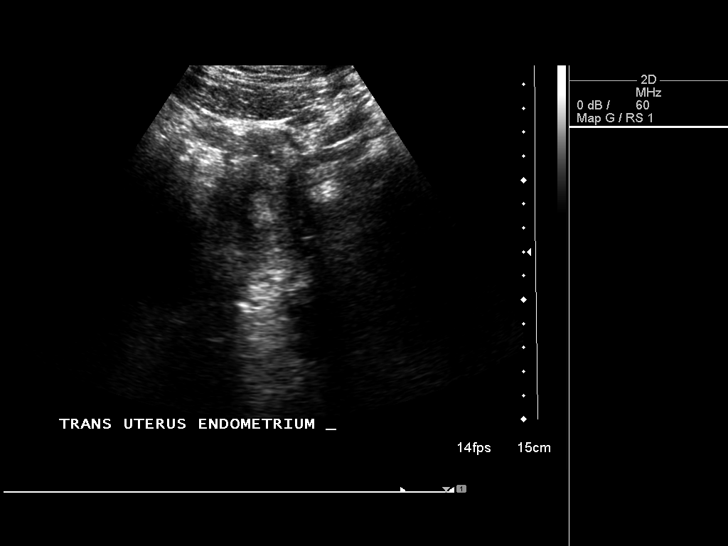
[im 38/90]
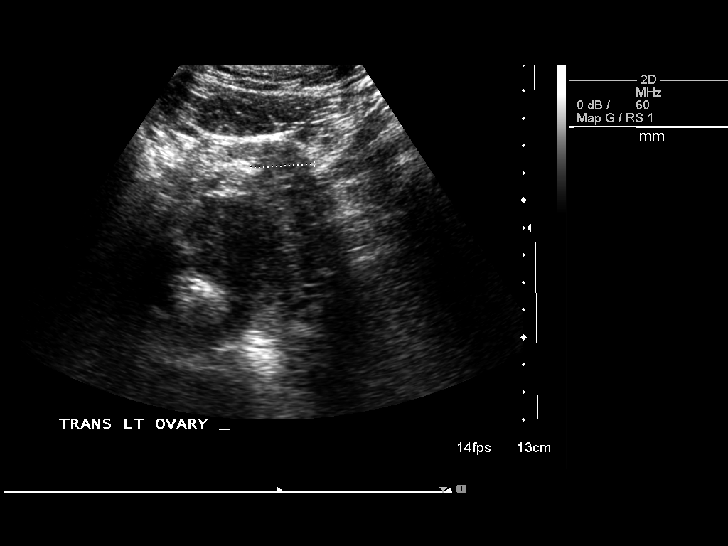
[im 45/90]
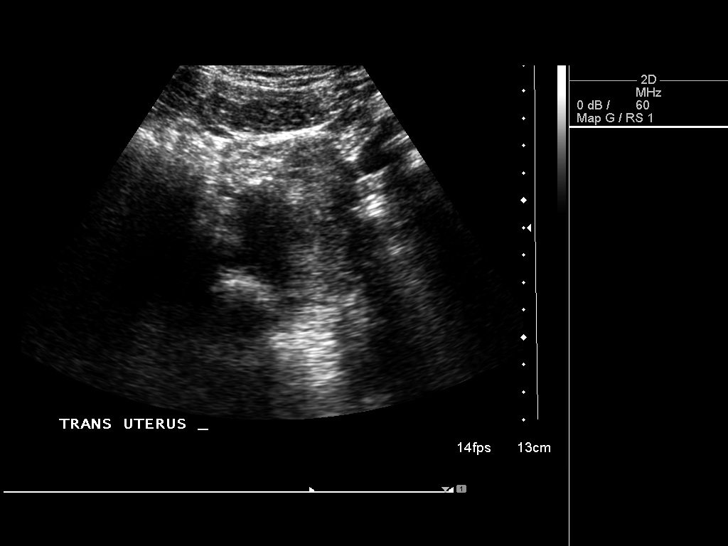
[im 52/90]
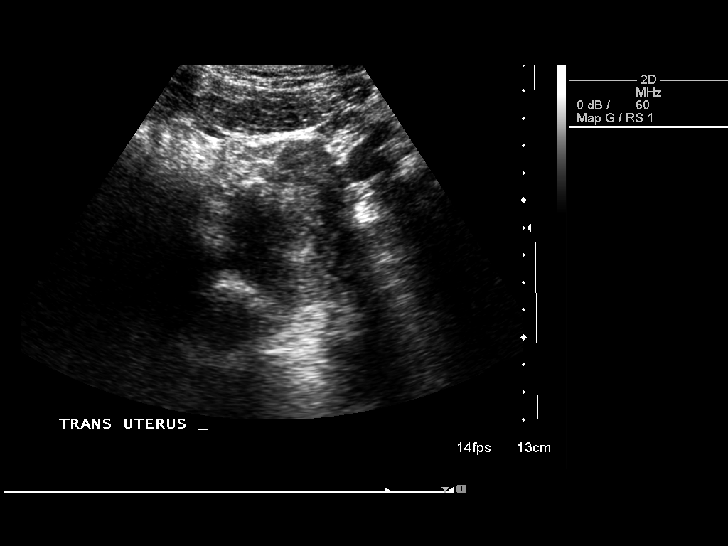
[im 60/90]
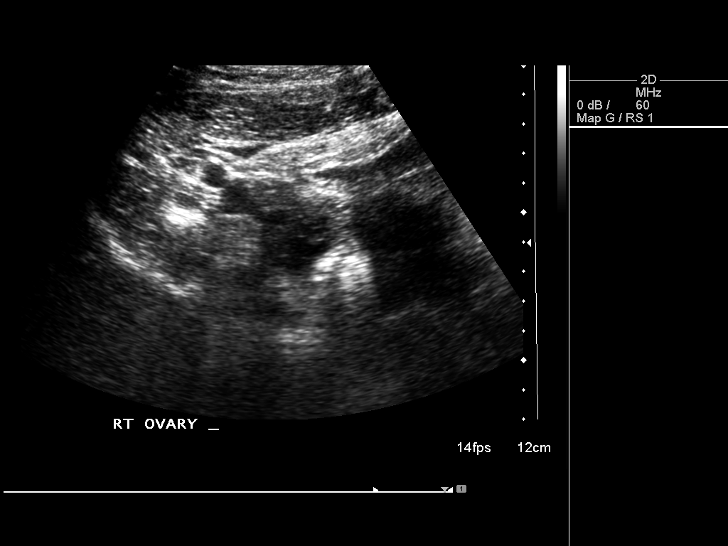
[im 67/90]
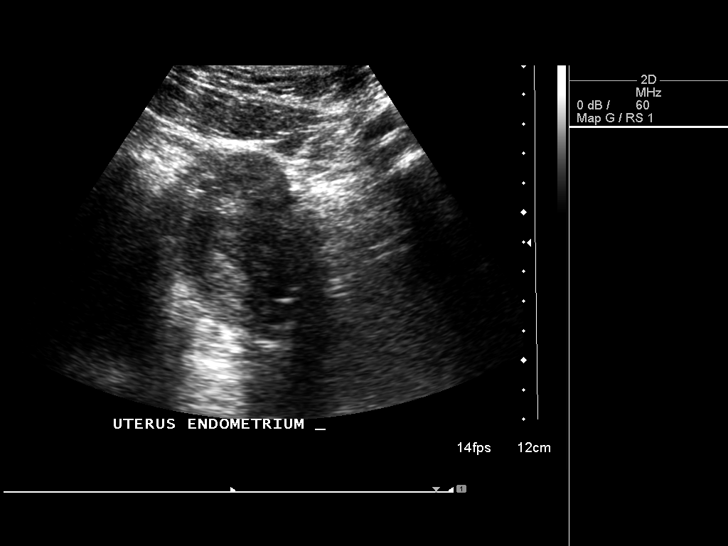
[im 75/90]
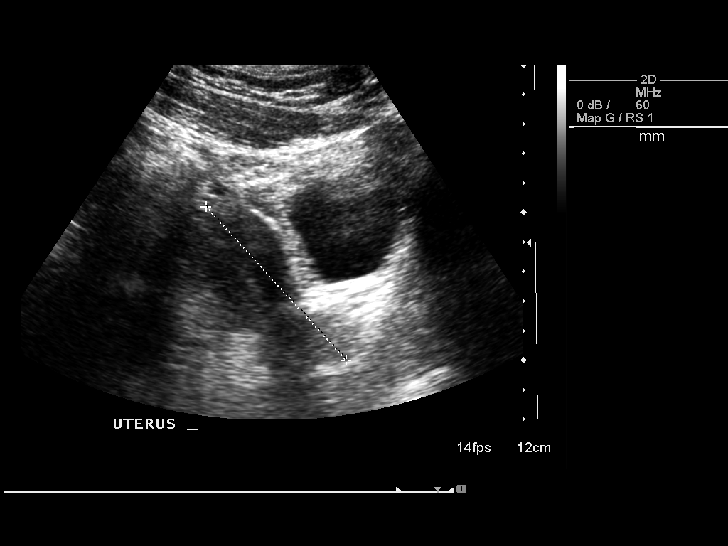
[im 82/90]
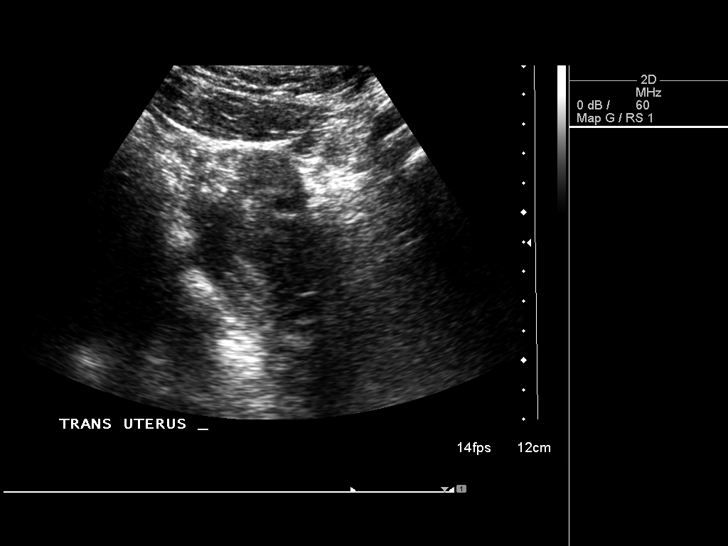
[im 90/90]
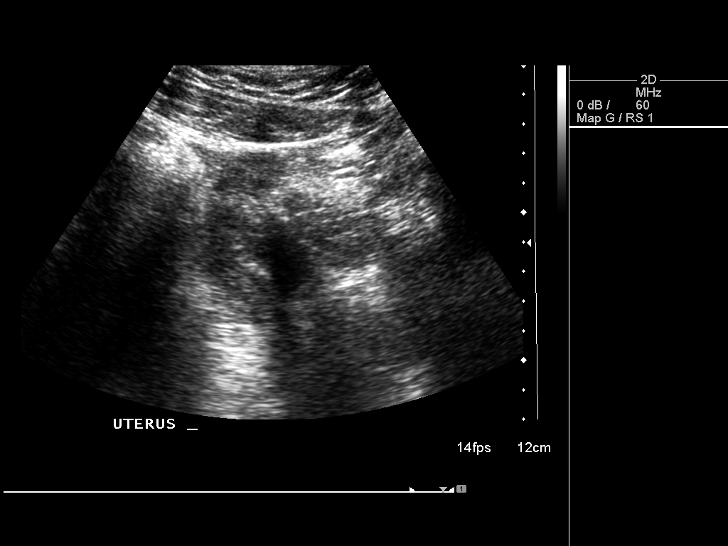

[13 of 25 positions shown; findings below may reference images not displayed]

FINDINGS: Uterus

Measurements: 7.6 x 3.7 x 4.5 cm. See below. Tiny amount of fluid
noted in the cervix .

Endometrium

Thickness: 13 mm. 1.6 cm by 1.3 cm endometrial versus submucosal
focal lesion in the mid portion of the uterus. This could represent
a true endometrial lesion or submucosal fibroid .

Right ovary

Measurements: 3.7 x 2.7 x 2.87. Normal appearance/no adnexal mass.

Left ovary

Measurements: 3.3 x 1.7 x 2.5 cm. Normal appearance/no adnexal mass.

Other findings

No free pelvic fluid .
IMPRESSION: 1.6 x 1.3 cm endometrial versus submucosal focal lesion in the
midportion of the uterus. This could represent a true endometrial
lesion or submucosal fibroid. A focal endometrial lesion
issuspected. Consider sonohysterogram forfurther evaluation, prior
to hysteroscopy or endometrial biopsy.

## 2015-04-26 ENCOUNTER — Telehealth: Payer: Self-pay | Admitting: Physician Assistant

## 2015-04-26 DIAGNOSIS — N9489 Other specified conditions associated with female genital organs and menstrual cycle: Secondary | ICD-10-CM

## 2015-04-26 NOTE — Telephone Encounter (Signed)
Tried calling pt -- keeps ringing, no option to leave voicemail.  Ultrasound found an endometrial lesion that needs further evaluation. They advised a sonohysterogram. This is where some saline is injected into uterus and again an ultrasound is performed to look at the lesion. This is very important that she gets done. I have placed order for the sonogram. Please try to keep calling. Let me know if patient has any questions.

## 2015-04-27 NOTE — Telephone Encounter (Signed)
Called pt, unable to leave message.

## 2015-04-27 NOTE — Telephone Encounter (Signed)
Spoke with pt. She is agreeable to getting the sonohysterogram.

## 2015-05-03 ENCOUNTER — Telehealth: Payer: Self-pay

## 2015-05-03 NOTE — Telephone Encounter (Signed)
LMOV.  Patient is scheduled for a sonohysterogram at [facility] on Wednesday 05/11/15 at 9:30am with a 9:15am arrival time.  She is to drink 32 oz of water about 30 min to 1 hour prior to her appointment.

## 2015-05-11 ENCOUNTER — Ambulatory Visit (HOSPITAL_COMMUNITY): Admission: RE | Admit: 2015-05-11 | Payer: 59 | Source: Ambulatory Visit

## 2015-07-31 ENCOUNTER — Encounter (HOSPITAL_BASED_OUTPATIENT_CLINIC_OR_DEPARTMENT_OTHER): Payer: Self-pay | Admitting: Emergency Medicine

## 2015-07-31 ENCOUNTER — Emergency Department (HOSPITAL_BASED_OUTPATIENT_CLINIC_OR_DEPARTMENT_OTHER)
Admission: EM | Admit: 2015-07-31 | Discharge: 2015-07-31 | Disposition: A | Discharge status: Home / Self Care | Payer: 59 | Attending: Emergency Medicine | Admitting: Emergency Medicine

## 2015-07-31 DIAGNOSIS — Z793 Long term (current) use of hormonal contraceptives: Secondary | ICD-10-CM | POA: Diagnosis not present

## 2015-07-31 DIAGNOSIS — H109 Unspecified conjunctivitis: Secondary | ICD-10-CM | POA: Insufficient documentation

## 2015-07-31 DIAGNOSIS — F17200 Nicotine dependence, unspecified, uncomplicated: Secondary | ICD-10-CM | POA: Insufficient documentation

## 2015-07-31 DIAGNOSIS — Z79899 Other long term (current) drug therapy: Secondary | ICD-10-CM | POA: Diagnosis not present

## 2015-07-31 DIAGNOSIS — J45909 Unspecified asthma, uncomplicated: Secondary | ICD-10-CM | POA: Diagnosis not present

## 2015-07-31 DIAGNOSIS — H578 Other specified disorders of eye and adnexa: Secondary | ICD-10-CM | POA: Diagnosis present

## 2015-07-31 MED ORDER — FLUORESCEIN SODIUM 1 MG OP STRP
ORAL_STRIP | OPHTHALMIC | Status: AC
  Administered 2015-07-31: 1
  Filled 2015-07-31: qty 1

## 2015-07-31 MED ORDER — FLUORESCEIN SODIUM 1 MG OP STRP: 1.0000 | ORAL_STRIP | Freq: Once | OPHTHALMIC | Status: DC

## 2015-07-31 MED ORDER — POLYMYXIN B-TRIMETHOPRIM 10000-0.1 UNIT/ML-% OP SOLN: 2.0000 [drp] | OPHTHALMIC | Status: DC

## 2015-07-31 MED ORDER — TETRACAINE HCL 0.5 % OP SOLN: 2.0000 [drp] | Freq: Once | OPHTHALMIC | Status: DC

## 2015-07-31 MED ORDER — TETRACAINE HCL 0.5 % OP SOLN
OPHTHALMIC | Status: AC
  Administered 2015-07-31: 2 [drp]
  Filled 2015-07-31: qty 4

## 2015-07-31 NOTE — ED Notes (Signed)
States was able to see to drive to ED, but unable to read visual acuity chart.

## 2015-07-31 NOTE — Discharge Instructions (Signed)
Return to the ED with any concerns including increased pain or swelling of eye, changes in vision, fever/chills, decreased level of alertness/lethargy, or any other alarming symptoms

## 2015-07-31 NOTE — ED Notes (Signed)
Pt states having redness, irritation, itching from left eye, states having some drainage from left eye as well. Onset last PM. States has rec no injury to left eye

## 2015-07-31 NOTE — ED Notes (Signed)
Pt unable to see visual acuity chart at all. States she normally wears bifocals but does not have them with her. Pt also states she drove herself to the ED.

## 2015-07-31 NOTE — ED Notes (Signed)
Patient states that she has had an itchy watery eye since last night.

## 2015-07-31 NOTE — ED Notes (Signed)
DC instructions reviewed with pt, discussed and pt teaching done in regards to proper application of eye drops as prescribed by EDP, reinforced hand washing before and after eye drop administration. Discussed not allowing tip of application bottle touch the eye area at any time. Pt able to state back instructions very well, allowed opportunity for questions and the time to repeat any or all of the instructions.

## 2015-07-31 NOTE — ED Provider Notes (Signed)
CSN: [id number]     Arrival date & time 07/31/15  1109 History   First MD Initiated Contact with Patient 07/31/15 1129     Chief Complaint  Patient presents with  . Eye Problem     (Consider location/radiation/quality/duration/timing/severity/associated sxs/prior Treatment) HPI  Pt presenting with c/o left eye redness and irritation.  More tearing than usual.  No trauma to eye.  No fever or pain.  No redness of eyelids or face.  She states symptoms started last night. She was advised to come to the ED by her job as she works in [employer].  No sick contacts.  She does not wear contacts. No changes in her vision- she states her vision is blurry now but this is her baseline without her glasses.  There are no other associated systemic symptoms, there are no other alleviating or modifying factors.   Past Medical History  Diagnosis Date  . Pain from implanted hardware 11/2011    retained syndesmosis screw right ankle  . Asthma    Past Surgical History  Procedure Laterality Date  . Cholecystectomy    . Tonsillectomy    . Orif ankle fracture  07/2011    right  . Hardware removal  11/22/2011    Procedure: HARDWARE REMOVAL;  Surgeon: Toni Arthurs, MD;  Location: [facility];  Service: Orthopedics;  Laterality: Right;   Family History  Problem Relation Age of Onset  . Anesthesia problems Mother     post-op N/V  . Hypertension Mother   . Diabetes Father    Social History  Substance Use Topics  . Smoking status: Current Every Day Smoker  . Smokeless tobacco: Never Used  . Alcohol Use: No   OB History    No data available     Review of Systems  ROS reviewed and all otherwise negative except for mentioned in HPI    Allergies  Tuna  Home Medications   Prior to Admission medications   Medication Sig Start Date End Date Taking? Authorizing Provider  albuterol (PROVENTIL HFA;VENTOLIN HFA) 108 (90 BASE) MCG/ACT inhaler Inhale 1-2 puffs into the lungs every 6 (six) hours  as needed for wheezing or shortness of breath. 08/19/14   Marlon Pel, PA-C  albuterol (PROVENTIL) (5 MG/ML) 0.5% nebulizer solution Take 0.5 mLs (2.5 mg total) by nebulization every 6 (six) hours as needed for wheezing or shortness of breath. 08/19/14   Tiffany Neva Seat, PA-C  ibuprofen (ADVIL,MOTRIN) 200 MG tablet Take 200 mg by mouth every 6 (six) hours as needed. Pain/ headache    Historical Provider, MD  ibuprofen (ADVIL,MOTRIN) 600 MG tablet Take 1 tablet (600 mg total) by mouth every 6 (six) hours as needed. Patient not taking: Reported on 04/01/2015 09/24/14   Shon Baton, MD  norethindrone-ethinyl estradiol-iron (MICROGESTIN FE,GILDESS FE,LOESTRIN FE) 1.5-30 MG-MCG tablet Take 1 tablet by mouth daily. 04/01/15   Dorna Leitz, PA-C  trimethoprim-polymyxin b (POLYTRIM) ophthalmic solution Place 2 drops into the left eye every 4 (four) hours. 07/31/15   Jerelyn Scott, MD   BP 147/97 mmHg  Pulse 94  Temp(Src) 98.6 F (37 C) (Oral)  Resp 18  Ht  (1.575 m)  Wt 295 lb (133.811 kg)  BMI 53.94 kg/m2  SpO2 100%  LMP 05/18/2015  Vitals reviewed Physical Exam  Physical Examination: General appearance - alert, well appearing, and in no distress Mental status - alert, oriented to person, place, and time Eyes - left sided conjunctival inection, EOM full and intact without  pain, PERRL Chest - clear to auscultation, no wheezes, rales or rhonchi, symmetric air entry Heart - normal rate, regular rhythm, normal S1, S2, no murmurs, rubs, clicks or gallops Neurological - alert, oriented, normal speech Extremities - peripheral pulses normal, no pedal edema, no clubbing or cyanosis Skin - normal coloration and turgor, no rashes  ED Course  Procedures (including critical care time) Labs Review Labs Reviewed - No data to display  Imaging Review No results found. I have personally reviewed and evaluated these images and lab results as part of my medical decision-making.   EKG  Interpretation None      MDM   Final diagnoses:  Conjunctivitis of left eye    Pt presenting with c/o left eye redness- fluorescein staining performed and negative.  Pt states her visual acuity is at her baseline.  Will start on polytrim drops for conjunctivitis.  No signs of preseptal or orbital cellulitis.  No trauma to eye.  Discussed the importance of warm compresses and handwashing.  Discharged with strict return precautions.  Pt agreeable with plan.    Jerelyn ScottMartha Linker, MD 07/31/15 [phone]

## 2015-12-27 ENCOUNTER — Emergency Department (HOSPITAL_BASED_OUTPATIENT_CLINIC_OR_DEPARTMENT_OTHER)
Admission: EM | Admit: 2015-12-27 | Discharge: 2015-12-27 | Disposition: A | Discharge status: Home / Self Care | Payer: 59 | Attending: Emergency Medicine | Admitting: Emergency Medicine

## 2015-12-27 ENCOUNTER — Emergency Department (HOSPITAL_BASED_OUTPATIENT_CLINIC_OR_DEPARTMENT_OTHER): Payer: 59

## 2015-12-27 ENCOUNTER — Encounter (HOSPITAL_BASED_OUTPATIENT_CLINIC_OR_DEPARTMENT_OTHER): Payer: Self-pay | Admitting: Emergency Medicine

## 2015-12-27 DIAGNOSIS — J40 Bronchitis, not specified as acute or chronic: Secondary | ICD-10-CM | POA: Diagnosis not present

## 2015-12-27 DIAGNOSIS — F17200 Nicotine dependence, unspecified, uncomplicated: Secondary | ICD-10-CM | POA: Insufficient documentation

## 2015-12-27 DIAGNOSIS — R0602 Shortness of breath: Secondary | ICD-10-CM | POA: Diagnosis present

## 2015-12-27 IMAGING — CR DG CHEST 2V
2 series · 2 of 2 positions shown · non-contrast
Comparison: [DATE]

CLINICAL DATA: Chest pain, dyspnea, fever and cough. Duration 2
days.

EXAM:
CHEST  2 VIEW

[w chest pa]
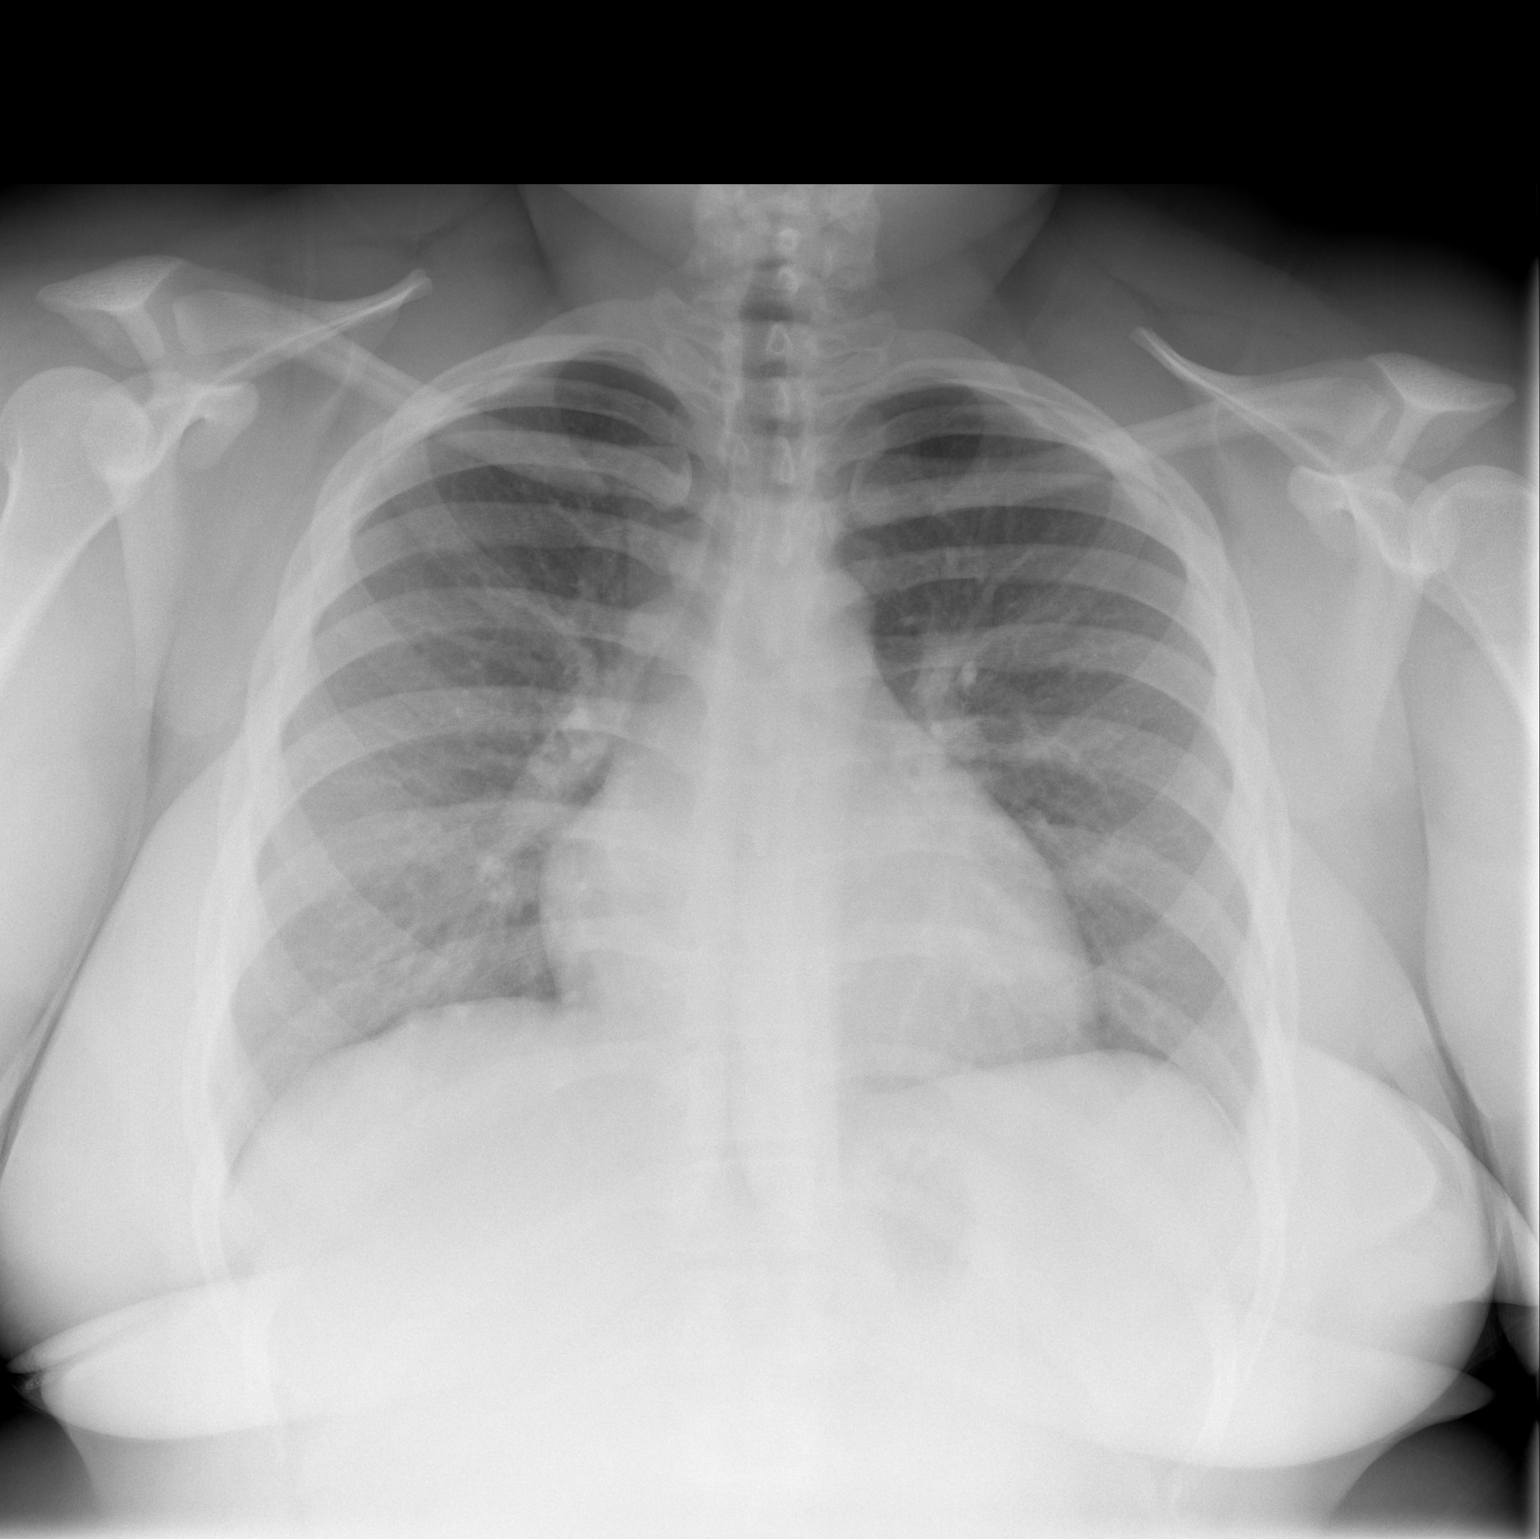

[w chest lat]
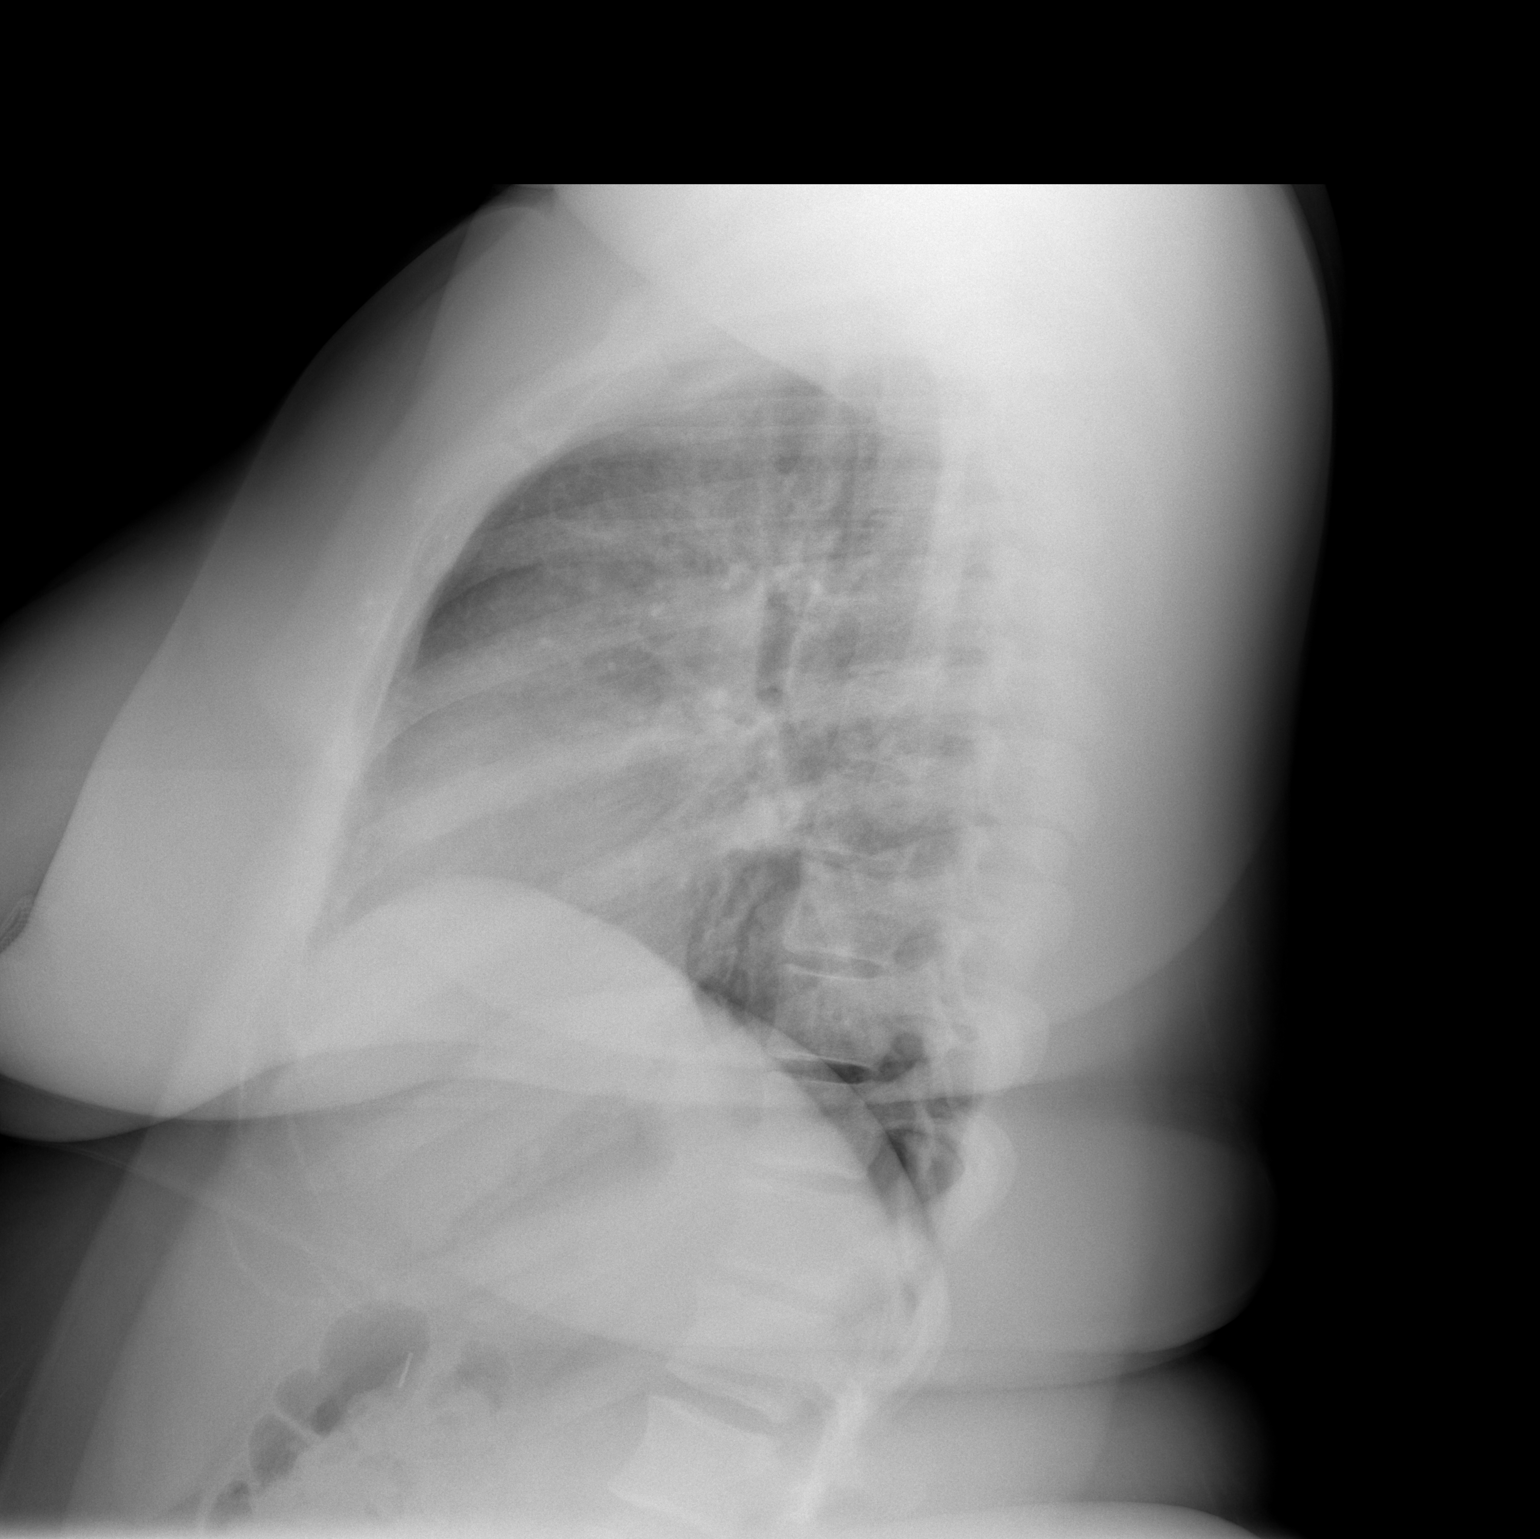

[2 of 2 positions shown; findings below may reference images not displayed]

FINDINGS: The heart size and mediastinal contours are within normal limits.
Both lungs are clear. The visualized skeletal structures are
unremarkable.
IMPRESSION: No active cardiopulmonary disease.

## 2015-12-27 MED ORDER — PREDNISONE 50 MG PO TABS
60.0000 mg | ORAL_TABLET | Freq: Once | ORAL | Status: AC
  Administered 2015-12-27: 60 mg via ORAL
  Filled 2015-12-27: qty 1

## 2015-12-27 MED ORDER — PREDNISONE 20 MG PO TABS: 40.0000 mg | ORAL_TABLET | Freq: Every day | ORAL | 0 refills | Status: DC

## 2015-12-27 MED ORDER — BENZONATATE 100 MG PO CAPS: 100.0000 mg | ORAL_CAPSULE | Freq: Three times a day (TID) | ORAL | 0 refills | Status: DC

## 2015-12-27 MED ORDER — ALBUTEROL SULFATE HFA 108 (90 BASE) MCG/ACT IN AERS
2.0000 | INHALATION_SPRAY | Freq: Once | RESPIRATORY_TRACT | Status: AC
  Administered 2015-12-27: 2 via RESPIRATORY_TRACT
  Filled 2015-12-27: qty 6.7

## 2015-12-27 MED ORDER — ALBUTEROL SULFATE (2.5 MG/3ML) 0.083% IN NEBU
5.0000 mg | INHALATION_SOLUTION | RESPIRATORY_TRACT | Status: AC
  Administered 2015-12-27: 5 mg via RESPIRATORY_TRACT
  Filled 2015-12-27: qty 6

## 2015-12-27 MED ORDER — ACETAMINOPHEN 325 MG PO TABS
650.0000 mg | ORAL_TABLET | Freq: Once | ORAL | Status: AC | PRN
  Administered 2015-12-27: 650 mg via ORAL
  Filled 2015-12-27: qty 2

## 2015-12-27 MED ORDER — IBUPROFEN 800 MG PO TABS
800.0000 mg | ORAL_TABLET | Freq: Once | ORAL | Status: AC
  Administered 2015-12-27: 800 mg via ORAL
  Filled 2015-12-27: qty 1

## 2015-12-27 MED ORDER — IPRATROPIUM BROMIDE 0.02 % IN SOLN
0.5000 mg | RESPIRATORY_TRACT | Status: AC
  Administered 2015-12-27: 0.5 mg via RESPIRATORY_TRACT
  Filled 2015-12-27: qty 2.5

## 2015-12-27 MED ORDER — IPRATROPIUM-ALBUTEROL 0.5-2.5 (3) MG/3ML IN SOLN: 3.0000 mL | Freq: Four times a day (QID) | RESPIRATORY_TRACT | Status: DC

## 2015-12-27 MED ORDER — IPRATROPIUM-ALBUTEROL 0.5-2.5 (3) MG/3ML IN SOLN
3.0000 mL | Freq: Four times a day (QID) | RESPIRATORY_TRACT | Status: DC
Start: 2015-12-27 — End: 2015-12-27
  Administered 2015-12-27: 3 mL via RESPIRATORY_TRACT
  Filled 2015-12-27: qty 3

## 2015-12-27 NOTE — ED Notes (Signed)
PA at bedside for pt eval.

## 2015-12-27 NOTE — ED Provider Notes (Signed)
[facility] Provider Note   CSN: [id number] Arrival date & time: 12/27/15  2010   By signing my name below, I, Nelwyn Salisbury, attest that this documentation has been prepared under the direction and in the presence of non-physician practitioner, Jaynie Crumble, PA-C. Electronically Signed: Nelwyn Salisbury, Scribe. 12/27/2015. 8:27 PM.   History   Chief Complaint Chief Complaint  Patient presents with  . Shortness of Breath   The history is provided by the patient. No language interpreter was used.    HPI Comments:  Samantha Allen is a 20 y.o. female with PMHx of asthma who presents to the Emergency Department complaining of constant worsening cough beginning yesterday and worsening today. Pt describes her cough as productive and notes that it is exacerbated by laying down. Pt states she has had asthma exacerbations in the past but these symptoms are worse. Pt has tried robitussin and Tylenol, with no relief. She endorses associated shortness of breath, fever, wheezing, chills, and rhinorrhea. Pt denies any sore throat. Pt also reports that she has a prescription for an inhaler, but has been noncompliant with use.    Past Medical History:  Diagnosis Date  . Asthma   . Pain from implanted hardware 11/2011   retained syndesmosis screw right ankle    Patient Active Problem List   Diagnosis Date Noted  . Anemia 04/07/2015  . Prediabetes 04/07/2015  . Obesity, morbid, BMI 50 or higher (HCC) 04/01/2015  . Abnormal uterine bleeding (AUB) 04/01/2015    Past Surgical History:  Procedure Laterality Date  . CHOLECYSTECTOMY    . HARDWARE REMOVAL  11/22/2011   Procedure: HARDWARE REMOVAL;  Surgeon: Toni Arthurs, MD;  Location: [facility];  Service: Orthopedics;  Laterality: Right;  . ORIF ANKLE FRACTURE  07/2011   right  . TONSILLECTOMY      OB History    No data available       Home Medications    Prior to Admission medications   Medication Sig Start  Date End Date Taking? Authorizing Provider  albuterol (PROVENTIL HFA;VENTOLIN HFA) 108 (90 BASE) MCG/ACT inhaler Inhale 1-2 puffs into the lungs every 6 (six) hours as needed for wheezing or shortness of breath. 08/19/14   Marlon Pel, PA-C  albuterol (PROVENTIL) (5 MG/ML) 0.5% nebulizer solution Take 0.5 mLs (2.5 mg total) by nebulization every 6 (six) hours as needed for wheezing or shortness of breath. 08/19/14   Tiffany Neva Seat, PA-C  benzonatate (TESSALON) 100 MG capsule Take 1 capsule (100 mg total) by mouth every 8 (eight) hours. 12/27/15   [place] Mckeough, PA-C  ibuprofen (ADVIL,MOTRIN) 200 MG tablet Take 200 mg by mouth every 6 (six) hours as needed. Pain/ headache    Historical Provider, MD  ibuprofen (ADVIL,MOTRIN) 600 MG tablet Take 1 tablet (600 mg total) by mouth every 6 (six) hours as needed. Patient not taking: Reported on 04/01/2015 09/24/14   Shon Baton, MD  norethindrone-ethinyl estradiol-iron (MICROGESTIN FE,GILDESS FE,LOESTRIN FE) 1.5-30 MG-MCG tablet Take 1 tablet by mouth daily. 04/01/15   Dorna Leitz, PA-C  predniSONE (DELTASONE) 20 MG tablet Take 2 tablets (40 mg total) by mouth daily. 12/27/15   Xavia Kniskern, PA-C  trimethoprim-polymyxin b (POLYTRIM) ophthalmic solution Place 2 drops into the left eye every 4 (four) hours. 07/31/15   Jerelyn Scott, MD    Family History Family History  Problem Relation Age of Onset  . Anesthesia problems Mother     post-op N/V  . Hypertension Mother   . Diabetes  Father     Social History Social History  Substance Use Topics  . Smoking status: Current Every Day Smoker  . Smokeless tobacco: Never Used  . Alcohol use No     Allergies   Tuna [fish allergy]   Review of Systems Review of Systems  Constitutional: Positive for chills and fever.  HENT: Positive for rhinorrhea. Negative for sore throat.   Respiratory: Positive for cough, shortness of breath and wheezing.   All other systems reviewed and are  negative.    Physical Exam Updated Vital Signs BP 112/78 (BP Location: Right Arm)   Pulse 116   Temp 100.1 F (37.8 C) (Oral)   Resp 24   Ht 5\' 3"  (1.6 m)   Wt 136.1 kg   LMP 12/27/2015   SpO2 98%   BMI 53.14 kg/m   Physical Exam  Constitutional: She appears well-developed and well-nourished. No distress.  HENT:  Head: Normocephalic.  Ear canals obstructed by cerumen, TMs appear to be normal. Oropharynx normal.  Eyes: Conjunctivae are normal.  Neck: Normal range of motion. Neck supple.  No meningismus  Cardiovascular: Normal rate, regular rhythm and normal heart sounds.   Pulmonary/Chest: She has wheezes. She has no rales.  Tachypnea, coughing  Abdominal: She exhibits no distension.  Musculoskeletal: She exhibits no edema.  Neurological: She is alert.  Skin: Skin is warm and dry.  Psychiatric: She has a normal mood and affect. Her behavior is normal.  Nursing note and vitals reviewed.    ED Treatments / Results  DIAGNOSTIC STUDIES:  Oxygen Saturation is 100% on RA, normal by my interpretation.    COORDINATION OF CARE:  8:26 PM Discussed treatment plan with pt at bedside which included breathing treatments and pt agreed to plan.  Labs (all labs ordered are listed, but only abnormal results are displayed) Labs Reviewed - No data to display  EKG  EKG Interpretation None       Radiology Dg Chest 2 View  Result Date: 12/27/2015 CLINICAL DATA:  Chest pain, dyspnea, fever and cough. Duration 2 days. EXAM: CHEST  2 VIEW COMPARISON:  09/23/2014 FINDINGS: The heart size and mediastinal contours are within normal limits. Both lungs are clear. The visualized skeletal structures are unremarkable. IMPRESSION: No active cardiopulmonary disease. Electronically Signed   By: Ellery Plunkaniel R Mitchell M.D.   On: 12/27/2015 21:18    Procedures Procedures (including critical care time)  Medications Ordered in ED Medications  albuterol (PROVENTIL) (2.5 MG/3ML) 0.083% nebulizer  solution 5 mg (5 mg Nebulization Given 12/27/15 2024)  ipratropium (ATROVENT) nebulizer solution 0.5 mg (0.5 mg Nebulization Given 12/27/15 2025)  acetaminophen (TYLENOL) tablet 650 mg (650 mg Oral Given 12/27/15 2045)  predniSONE (DELTASONE) tablet 60 mg (60 mg Oral Given 12/27/15 2119)  albuterol (PROVENTIL HFA;VENTOLIN HFA) 108 (90 Base) MCG/ACT inhaler 2 puff (2 puffs Inhalation Given 12/27/15 2209)  ibuprofen (ADVIL,MOTRIN) tablet 800 mg (800 mg Oral Given 12/27/15 2303)     Initial Impression / Assessment and Plan / ED Course  I have reviewed the triage vital signs and the nursing notes.  Pertinent labs & imaging results that were available during my care of the patient were reviewed by me and considered in my medical decision making (see chart for details).  Clinical Course  Comment By Time  Patient seen and examined. Patient with fever, temperature 101.8, mild tachycardia, wheezing on lung exam. Patient is coughing. Onset of symptoms yesterday. History of asthma and pneumonia. Will get chest x-ray, DuoNeb nebs ordered, will give  prednisone 60 mg in emergency department. Jaynie Crumble, PA-C 09/12 2008  Chest x-ray is negative. Patient feels better after second breathing treatment. Will order third treatment. Tachypnea resolved. Still wheezing. Will reassess. Jaynie Crumble, PA-C 09/12 2138  Patient is feeling much better. Her respiratory issues resolved. She has no pain. Oxygen is 100% on room air. Her temperature is still 100.1, heart rate is 116, most likely still from breathing treatments. She says she feels like there is ready for discharge home. Patient given albuterol inhaler, will start on a short course of prednisone, Tessalon for cough, follow with primary care doctor. Jaynie Crumble, PA-C 09/12 2301    Patient emergency department with cough, wheezing, shortness of breath, fever onset yesterday. Symptoms most consistent with viral upper respiratory tract  infection/bronchitis. Chest x-ray is negative for pneumonia. Patient feels better after several breathing treatments, prednisone. Will discharge home with short prednisone course, inhaler, which was given to her in emergency department, Tessalon pearls, follow with primary care doctor. Return precautions discussed   Vitals:   12/27/15 2015 12/27/15 2025 12/27/15 2237  BP: 152/82  112/78  Pulse: 108  116  Resp: 24  24  Temp: 101.8 F (38.8 C)  100.1 F (37.8 C)  TempSrc: Oral  Oral  SpO2: 98% 100% 98%  Weight: 136.1 kg    Height: 5\' 3"  (1.6 m)      Final Clinical Impressions(s) / ED Diagnoses   Final diagnoses:  Bronchitis    New Prescriptions Discharge Medication List as of 12/27/2015 11:05 PM    START taking these medications   Details  benzonatate (TESSALON) 100 MG capsule Take 1 capsule (100 mg total) by mouth every 8 (eight) hours., Starting Tue 12/27/2015, Print    predniSONE (DELTASONE) 20 MG tablet Take 2 tablets (40 mg total) by mouth daily., Starting Tue 12/27/2015, Print       I personally performed the services described in this documentation, which was scribed in my presence. The recorded information has been reviewed and is accurate.     Jaynie Crumble, PA-C 12/27/15 2310    Nira Conn, MD 12/29/15 [phone]

## 2015-12-27 NOTE — ED Triage Notes (Signed)
Patient states that she has had trouble breathing x 2 -3 days. Reports that she is coughing with nothing coming up. The patient looks like she does not feel well.

## 2015-12-27 NOTE — ED Notes (Signed)
Patient transported to X-ray 

## 2015-12-27 NOTE — Discharge Instructions (Signed)
Ibuprofen and Tylenol for fever. Take inhaler, 2 puffs every 4 hours. Prednisone as prescribed until all gone.  Take Tessalon for cough. Make sure to drink plenty of fluids, rest. Follow-up with primary care doctor for recheck. Return if worsening

## 2016-02-16 IMAGING — US US ART/VEN ABD/PELV/SCROTUM DOPPLER LTD
1 series · 13 of 25 positions shown · non-contrast
Comparison: Pelvic ultrasound [DATE]

CLINICAL DATA: Pelvic pain beginning last night.

EXAM:
TRANSABDOMINAL AND TRANSVAGINAL ULTRASOUND OF PELVIS
DOPPLER ULTRASOUND OF OVARIES
TECHNIQUE: Both transabdominal and transvaginal ultrasound examinations of the
pelvis were performed. Transabdominal technique was performed for
global imaging of the pelvis including uterus, ovaries, adnexal
regions, and pelvic cul-de-sac.
It was necessary to proceed with endovaginal exam following the
transabdominal exam to visualize the endometrium and adnexa. Color
and duplex Doppler ultrasound was utilized to evaluate blood flow to
the ovaries.

[Series 1: us art/ven abd/pelv/scrotum doppler ltd · 0.25mm/px · 67 acquisitions, 13 frames shown]
[im 1/67]
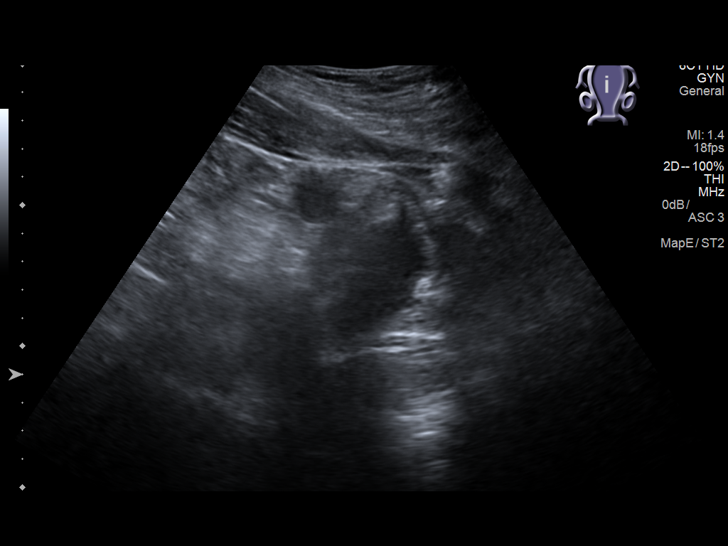
[im 6/67]
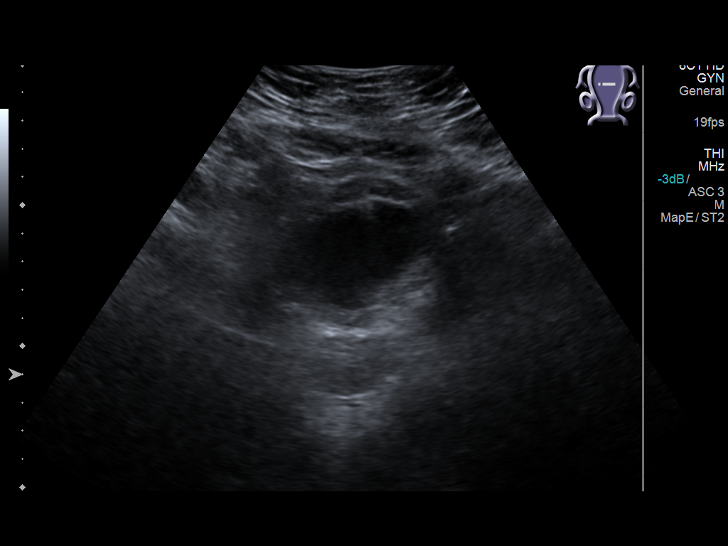
[im 12/67]
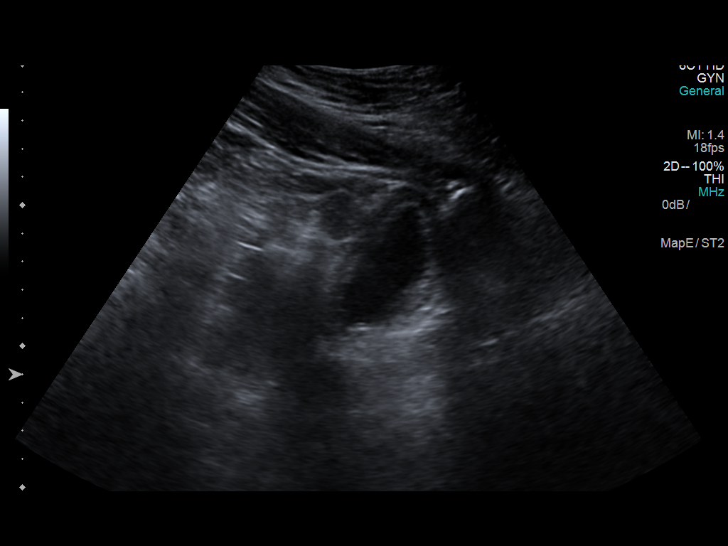
[im 17/67]
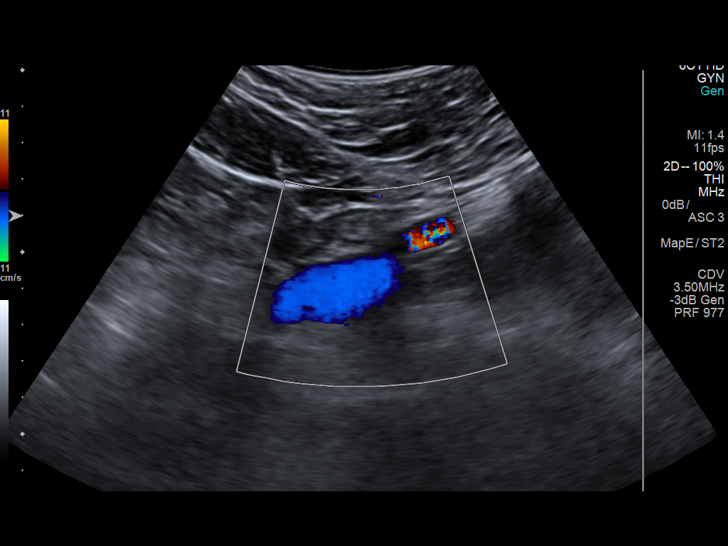
[im 23/67]
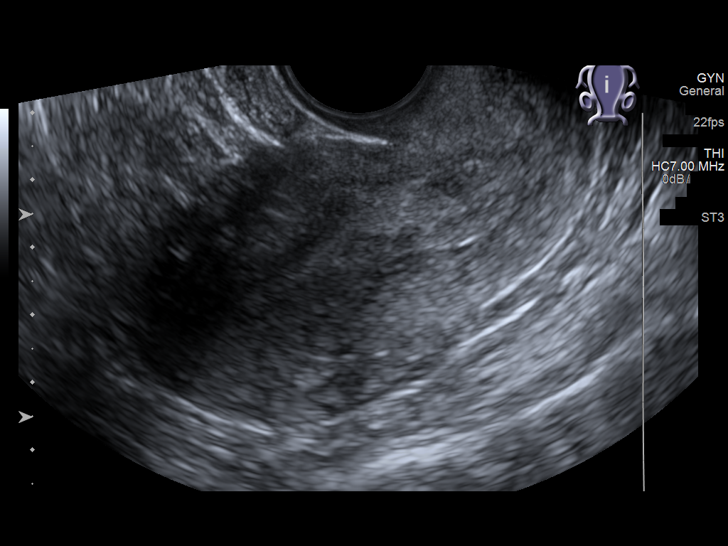
[im 28/67]
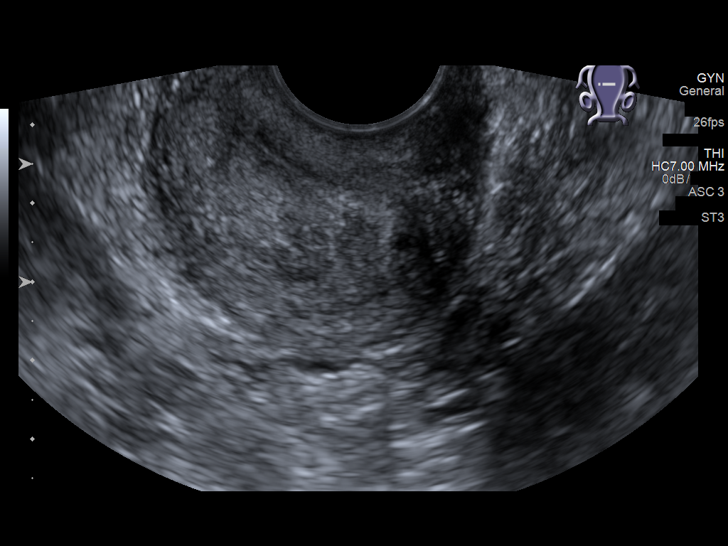
[im 34/67]
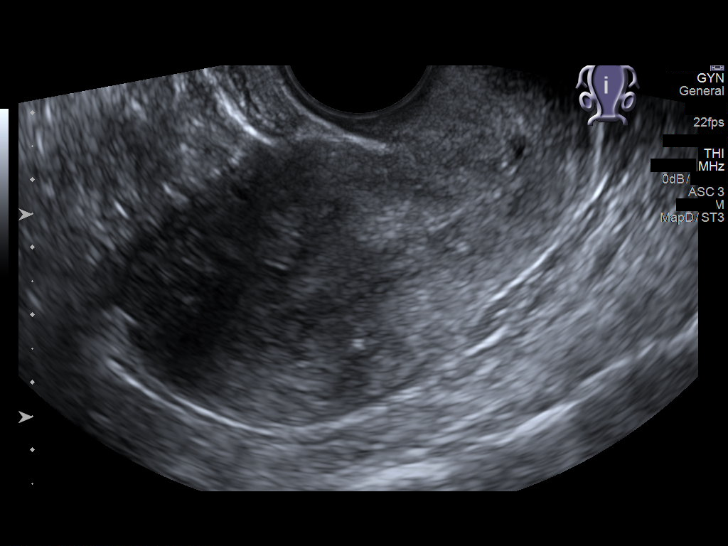
[im 39/67]
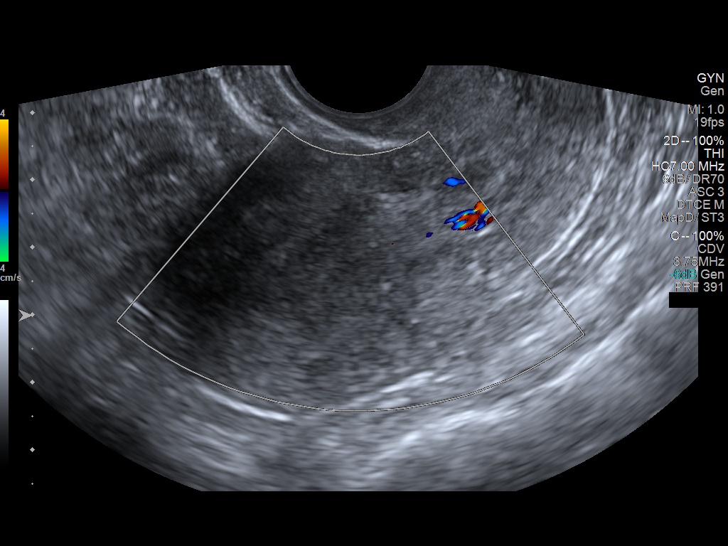
[im 45/67]
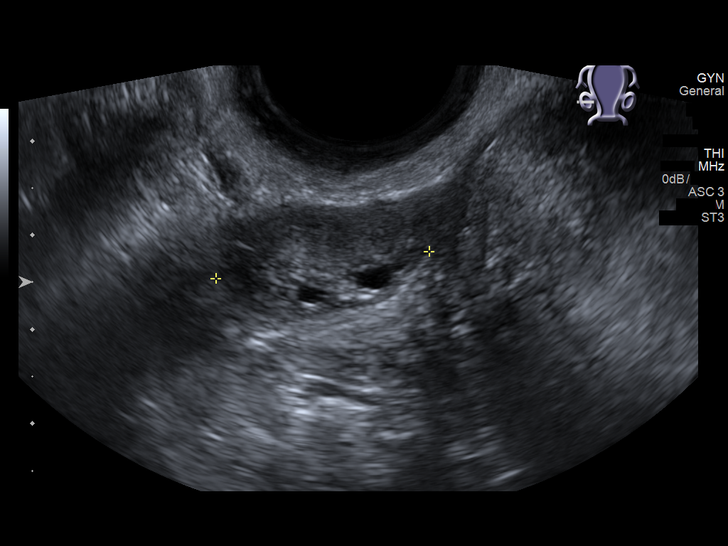
[im 50/67]
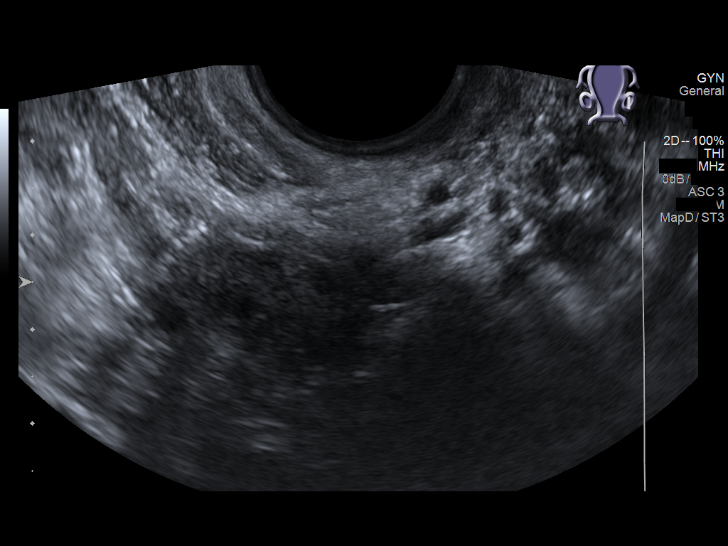
[im 56/67]
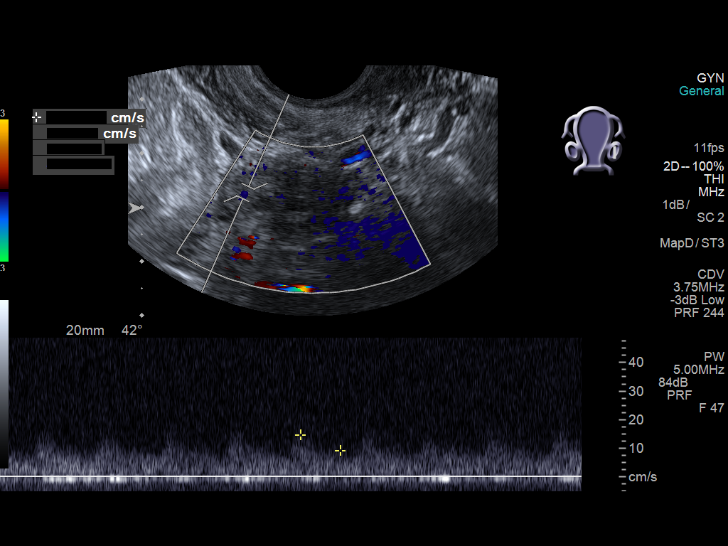
[im 61/67]
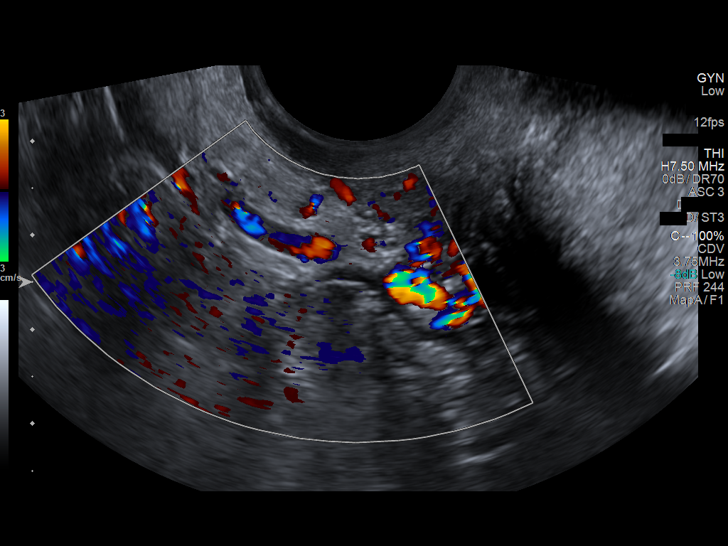
[im 67/67]
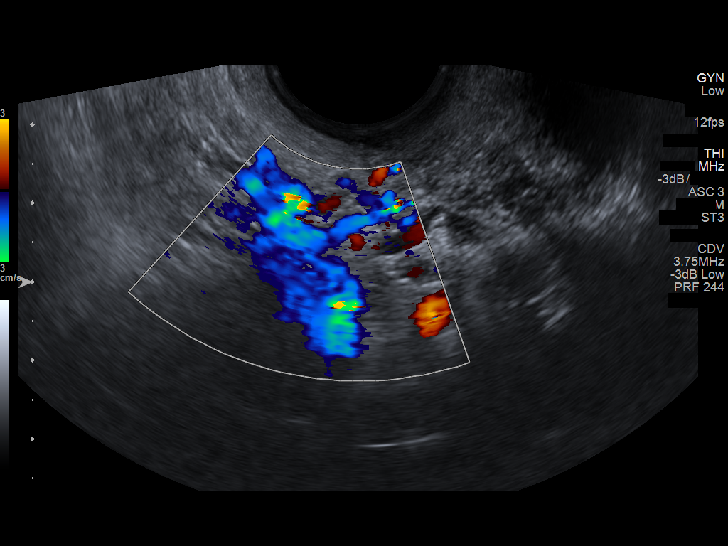

[13 of 25 positions shown; findings below may reference images not displayed]

FINDINGS: Habitus limited examination.

Uterus

Measurements: 7.4 x 4 x 5.1 cm. No fibroids or other mass
visualized. Nabothian cysts at the level of the cervix.

Endometrium

Thickness: 15 mm.  No focal abnormality visualized.

Right ovary

Measurements: 3.3 x 1.7 x 2.3 cm. Normal appearance/no adnexal mass.

Left ovary

Measurements: 2.4 x 1.5 x 2.1 cm. Normal appearance/no adnexal mass
the limited by habitus and presumed posterior location.

Pulsed Doppler evaluation of both ovaries demonstrates normal
low-resistance arterial and venous waveforms.

Other findings

No abnormal free fluid.
IMPRESSION: Negative habitus limited pelvic sonogram.

## 2016-03-20 ENCOUNTER — Emergency Department (HOSPITAL_BASED_OUTPATIENT_CLINIC_OR_DEPARTMENT_OTHER)
Admission: EM | Admit: 2016-03-20 | Discharge: 2016-03-20 | Disposition: A | Discharge status: Home / Self Care | Payer: 59 | Attending: Dermatology | Admitting: Dermatology

## 2016-03-20 ENCOUNTER — Encounter (HOSPITAL_BASED_OUTPATIENT_CLINIC_OR_DEPARTMENT_OTHER): Payer: Self-pay | Admitting: *Deleted

## 2016-03-20 ENCOUNTER — Emergency Department (HOSPITAL_BASED_OUTPATIENT_CLINIC_OR_DEPARTMENT_OTHER): Payer: 59

## 2016-03-20 DIAGNOSIS — W2201XA Walked into wall, initial encounter: Secondary | ICD-10-CM | POA: Diagnosis not present

## 2016-03-20 DIAGNOSIS — M549 Dorsalgia, unspecified: Secondary | ICD-10-CM | POA: Diagnosis not present

## 2016-03-20 DIAGNOSIS — Y939 Activity, unspecified: Secondary | ICD-10-CM | POA: Diagnosis not present

## 2016-03-20 DIAGNOSIS — Y999 Unspecified external cause status: Secondary | ICD-10-CM | POA: Insufficient documentation

## 2016-03-20 DIAGNOSIS — Y929 Unspecified place or not applicable: Secondary | ICD-10-CM | POA: Diagnosis not present

## 2016-03-20 DIAGNOSIS — Z791 Long term (current) use of non-steroidal anti-inflammatories (NSAID): Secondary | ICD-10-CM | POA: Diagnosis not present

## 2016-03-20 DIAGNOSIS — F17200 Nicotine dependence, unspecified, uncomplicated: Secondary | ICD-10-CM | POA: Diagnosis not present

## 2016-03-20 DIAGNOSIS — J45909 Unspecified asthma, uncomplicated: Secondary | ICD-10-CM | POA: Insufficient documentation

## 2016-03-20 DIAGNOSIS — Z79899 Other long term (current) drug therapy: Secondary | ICD-10-CM | POA: Diagnosis not present

## 2016-03-20 DIAGNOSIS — S6991XA Unspecified injury of right wrist, hand and finger(s), initial encounter: Secondary | ICD-10-CM | POA: Insufficient documentation

## 2016-03-20 IMAGING — DX DG HAND COMPLETE 3+V*R*
3 series · 3 of 3 positions shown · non-contrast
Comparison: Middle finger radiographs from [DATE]

CLINICAL DATA: Pain after punching a wall to nights ago mostly near
the pain cheek.

EXAM:
RIGHT HAND - COMPLETE 3+ VIEW

[hand pa]
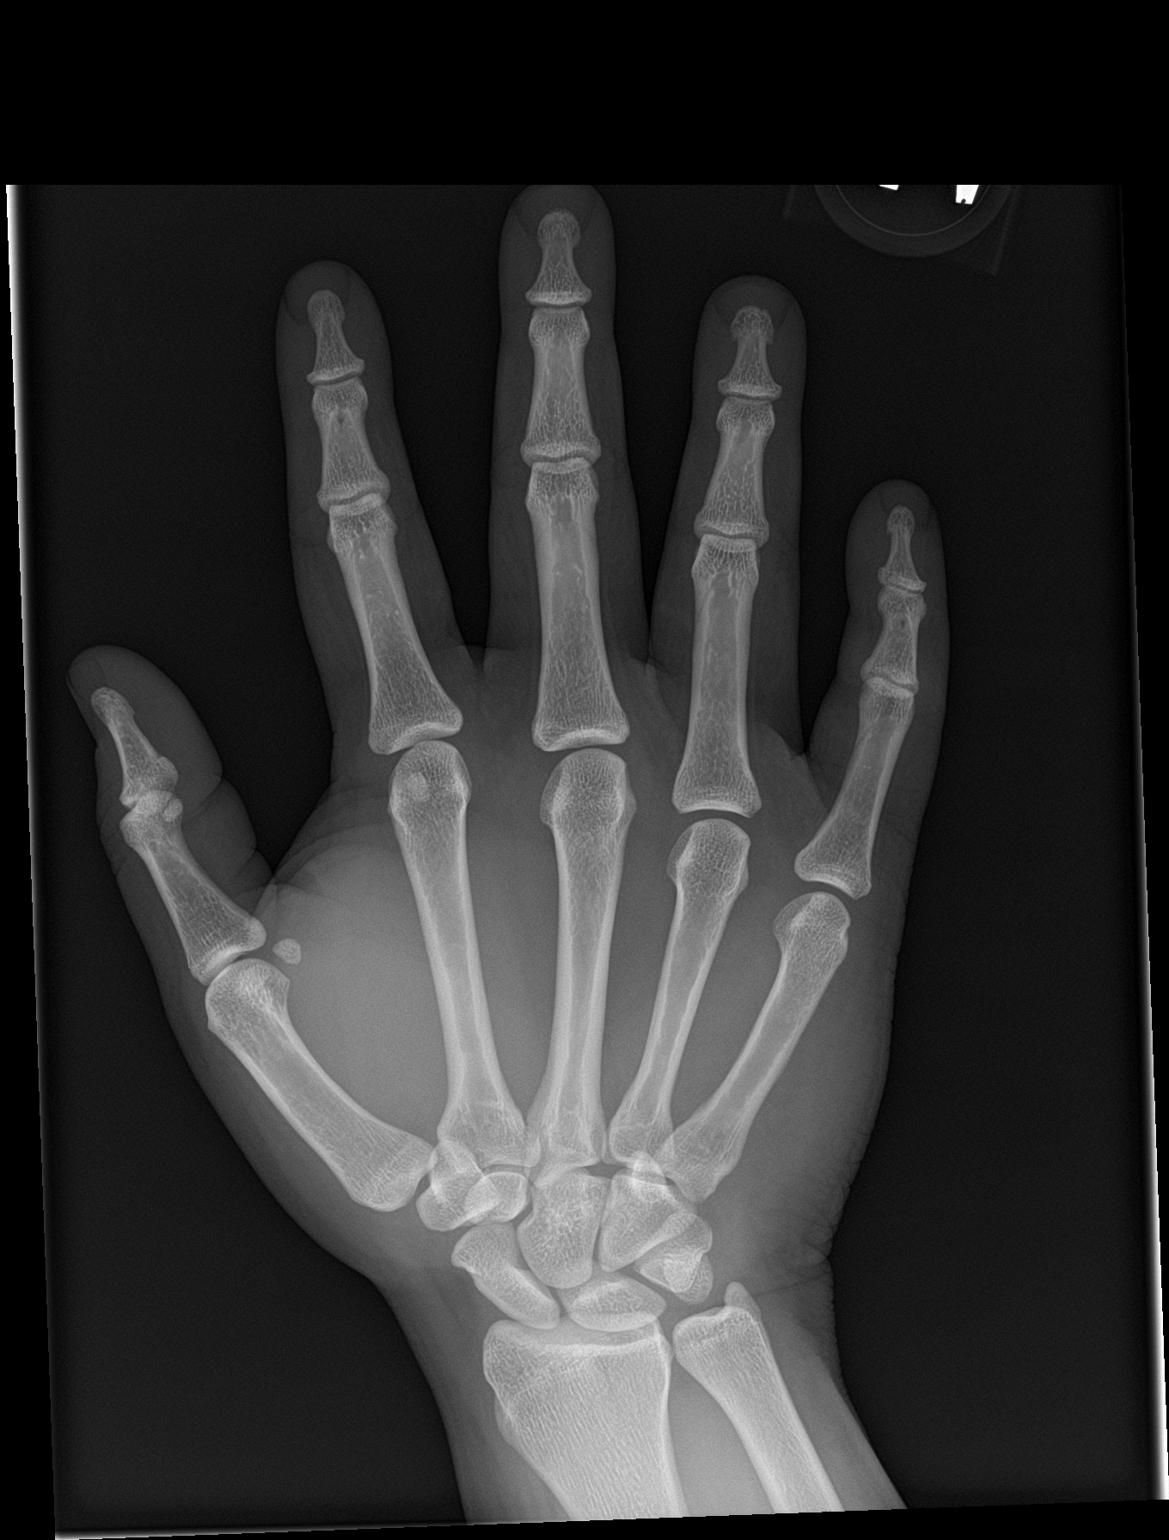

[hand obl]
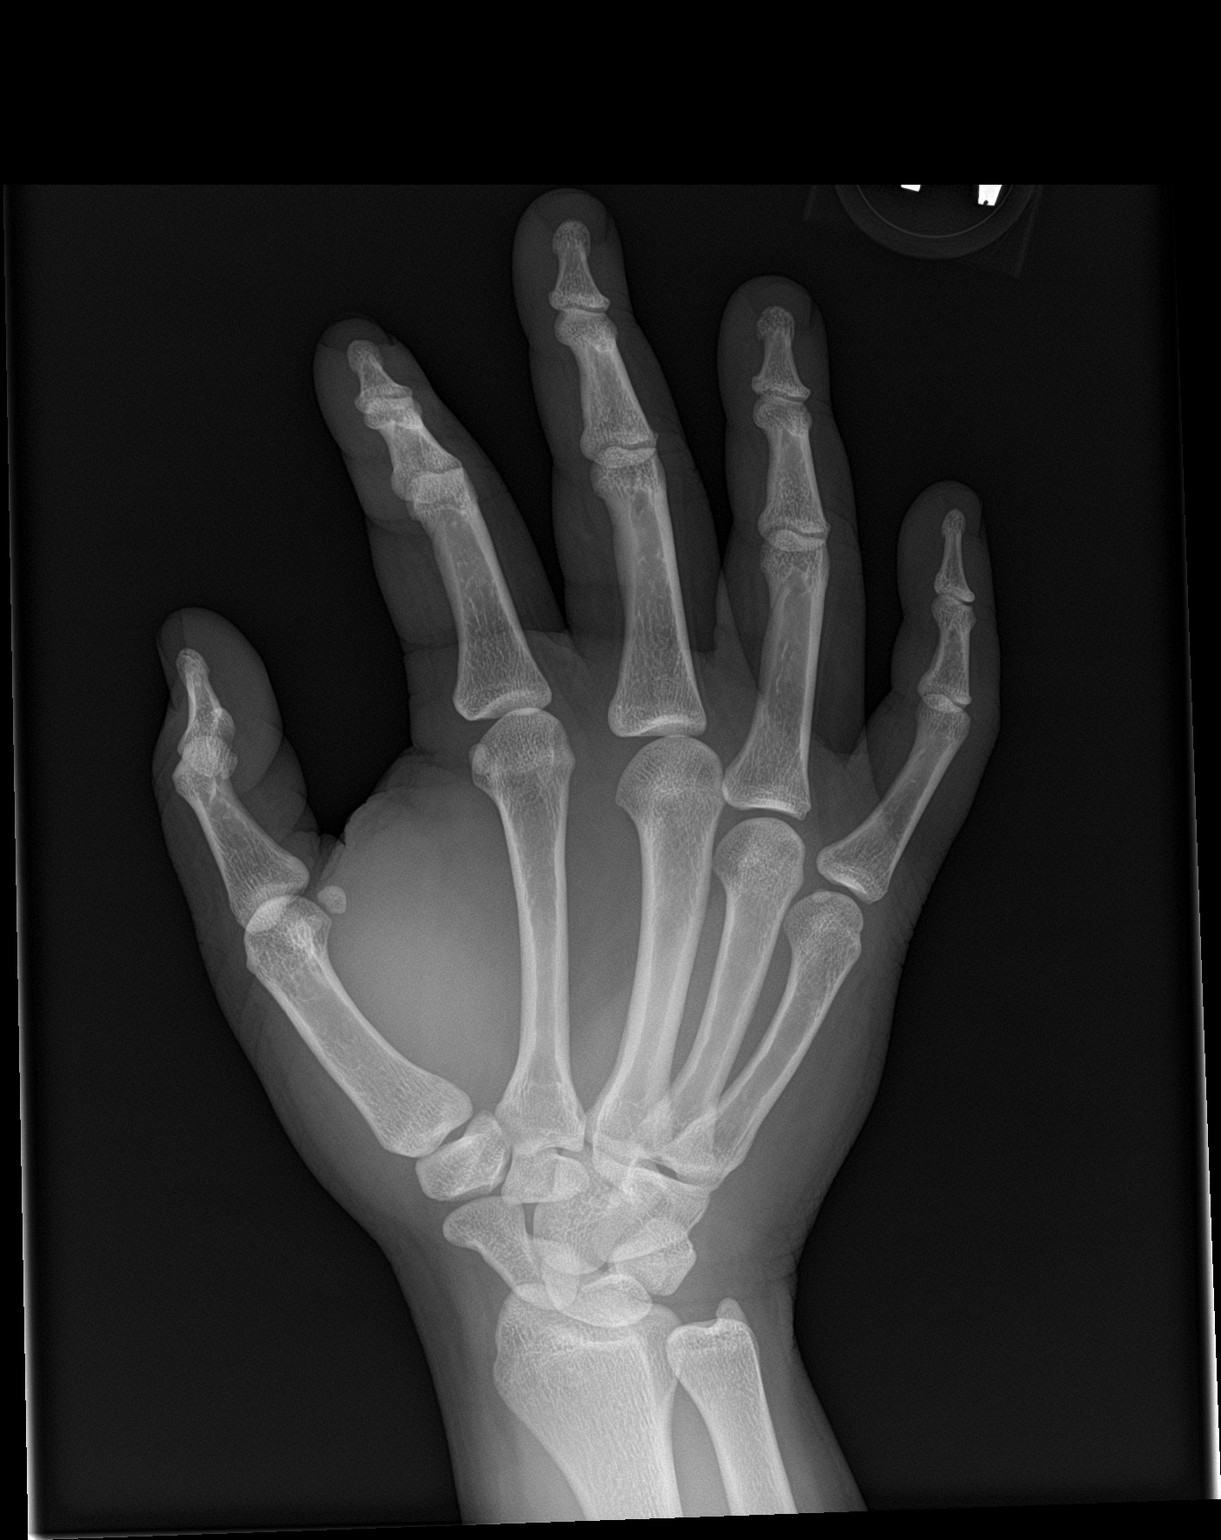

[hand lat]
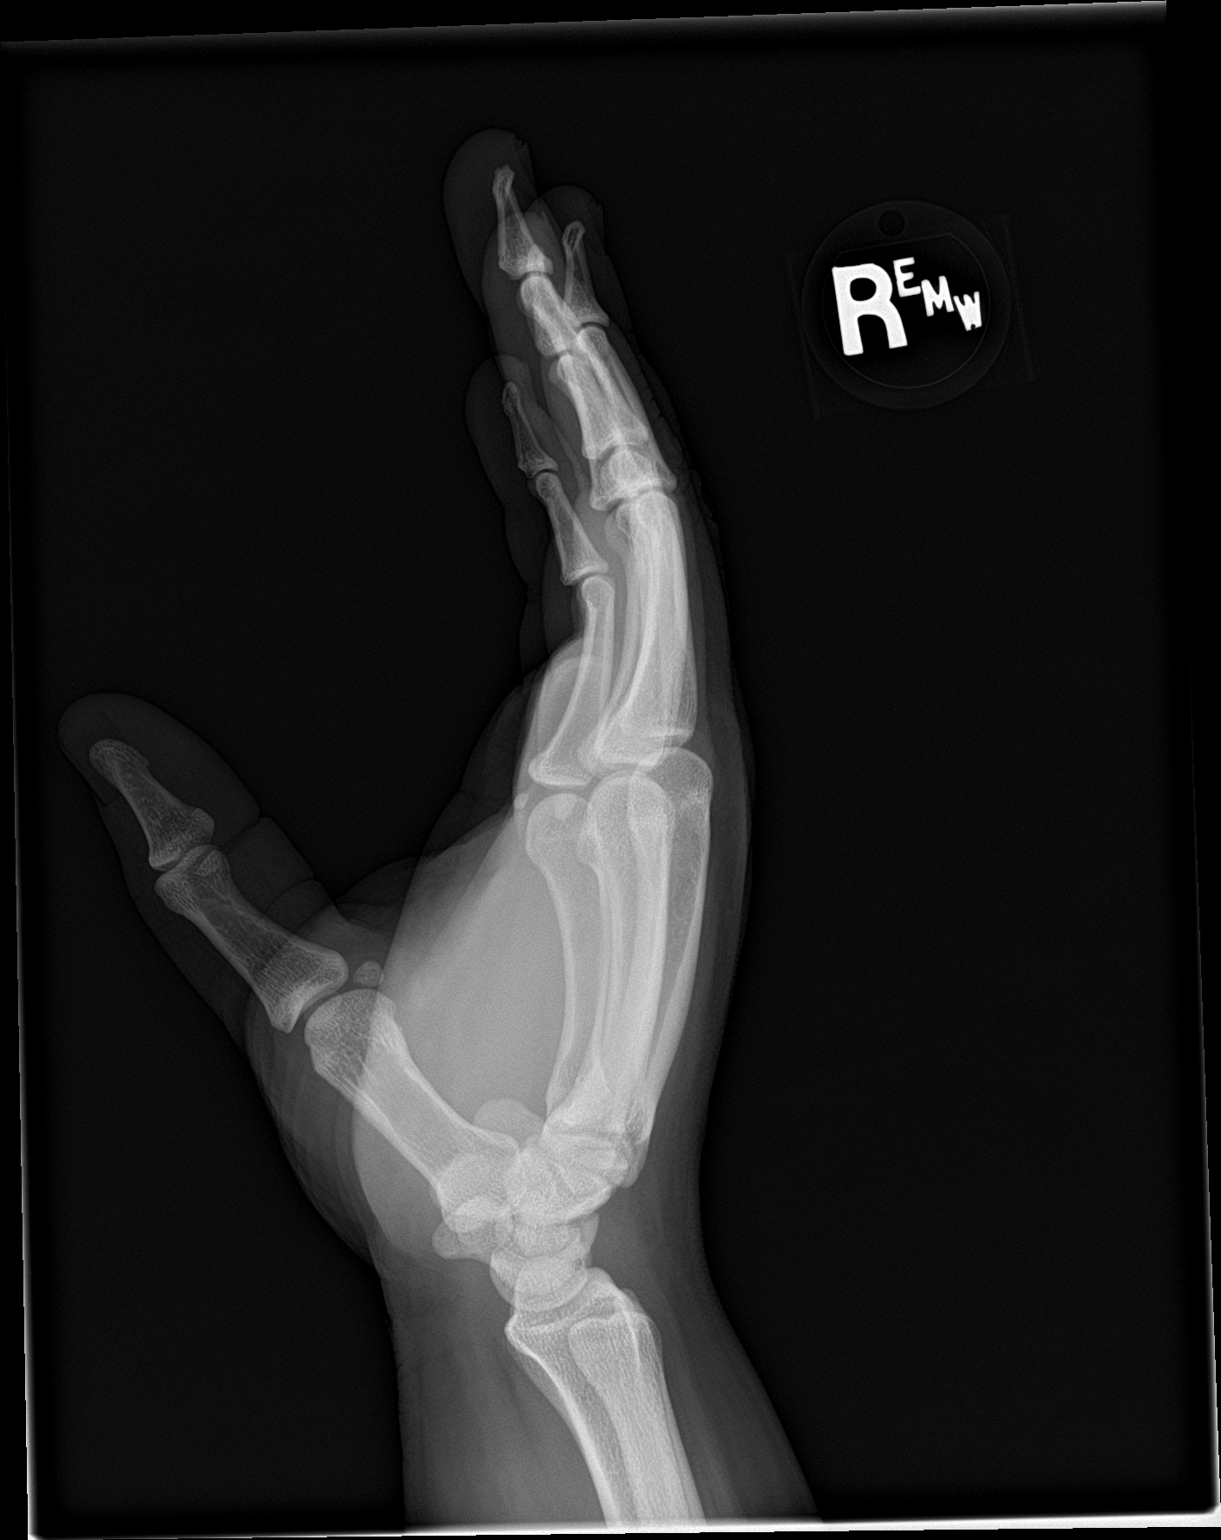

[3 of 3 positions shown; findings below may reference images not displayed]

FINDINGS: There is no evidence of fracture or dislocation. There is no
evidence of arthropathy or other focal bone abnormality. Soft
tissues are unremarkable.
IMPRESSION: No radiographically apparent fracture.  No malalignment.

## 2016-03-20 NOTE — ED Triage Notes (Signed)
Right hand injury. She hit a wall with her fist 2 days ago. Back pain unrelated.

## 2017-02-27 DIAGNOSIS — R197 Diarrhea, unspecified: Secondary | ICD-10-CM | POA: Diagnosis not present

## 2017-02-27 DIAGNOSIS — R1084 Generalized abdominal pain: Secondary | ICD-10-CM | POA: Diagnosis not present

## 2017-02-27 DIAGNOSIS — R112 Nausea with vomiting, unspecified: Secondary | ICD-10-CM | POA: Diagnosis not present

## 2017-06-06 ENCOUNTER — Emergency Department (HOSPITAL_BASED_OUTPATIENT_CLINIC_OR_DEPARTMENT_OTHER)
Admission: EM | Admit: 2017-06-06 | Discharge: 2017-06-06 | Disposition: A | Discharge status: Home / Self Care | Payer: 59 | Attending: Emergency Medicine | Admitting: Emergency Medicine

## 2017-06-06 ENCOUNTER — Other Ambulatory Visit: Payer: Self-pay

## 2017-06-06 ENCOUNTER — Emergency Department (HOSPITAL_BASED_OUTPATIENT_CLINIC_OR_DEPARTMENT_OTHER): Payer: 59

## 2017-06-06 ENCOUNTER — Encounter (HOSPITAL_BASED_OUTPATIENT_CLINIC_OR_DEPARTMENT_OTHER): Payer: Self-pay | Admitting: Emergency Medicine

## 2017-06-06 DIAGNOSIS — R05 Cough: Secondary | ICD-10-CM | POA: Diagnosis present

## 2017-06-06 DIAGNOSIS — F17200 Nicotine dependence, unspecified, uncomplicated: Secondary | ICD-10-CM | POA: Diagnosis not present

## 2017-06-06 DIAGNOSIS — R509 Fever, unspecified: Secondary | ICD-10-CM | POA: Diagnosis not present

## 2017-06-06 DIAGNOSIS — Z79899 Other long term (current) drug therapy: Secondary | ICD-10-CM | POA: Insufficient documentation

## 2017-06-06 DIAGNOSIS — J111 Influenza due to unidentified influenza virus with other respiratory manifestations: Secondary | ICD-10-CM | POA: Insufficient documentation

## 2017-06-06 DIAGNOSIS — M791 Myalgia, unspecified site: Secondary | ICD-10-CM | POA: Diagnosis not present

## 2017-06-06 DIAGNOSIS — R0989 Other specified symptoms and signs involving the circulatory and respiratory systems: Secondary | ICD-10-CM | POA: Insufficient documentation

## 2017-06-06 DIAGNOSIS — J45909 Unspecified asthma, uncomplicated: Secondary | ICD-10-CM | POA: Insufficient documentation

## 2017-06-06 DIAGNOSIS — R0981 Nasal congestion: Secondary | ICD-10-CM | POA: Diagnosis not present

## 2017-06-06 DIAGNOSIS — R51 Headache: Secondary | ICD-10-CM | POA: Insufficient documentation

## 2017-06-06 DIAGNOSIS — R5383 Other fatigue: Secondary | ICD-10-CM | POA: Diagnosis not present

## 2017-06-06 IMAGING — CR DG CHEST 2V
2 series · 2 of 2 positions shown · non-contrast
Comparison: [DATE] and earlier.

CLINICAL DATA: 21-year-old female with cough, body ache, and
headache for 2 days.

EXAM:
CHEST  2 VIEW

[w chest pa]
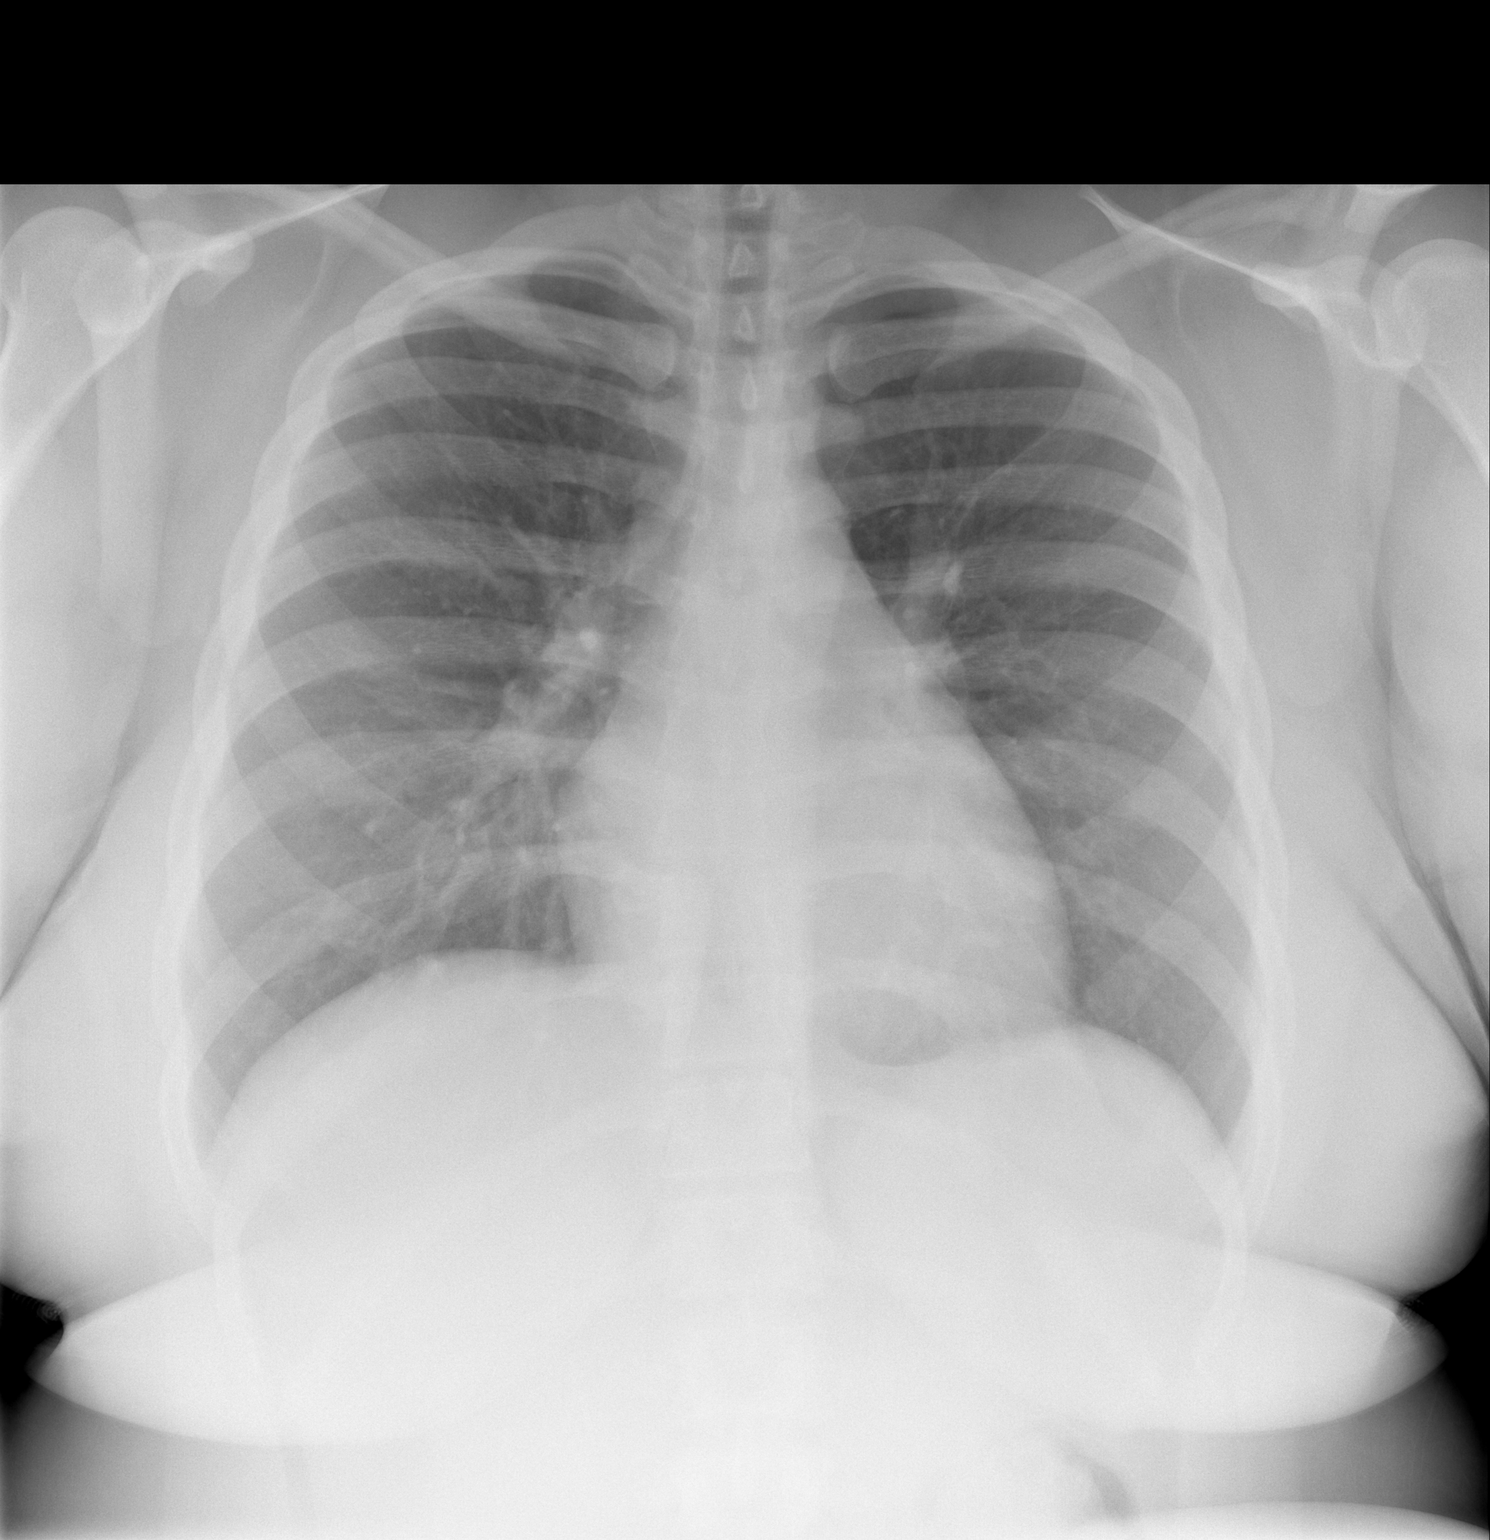

[w chest lat *]
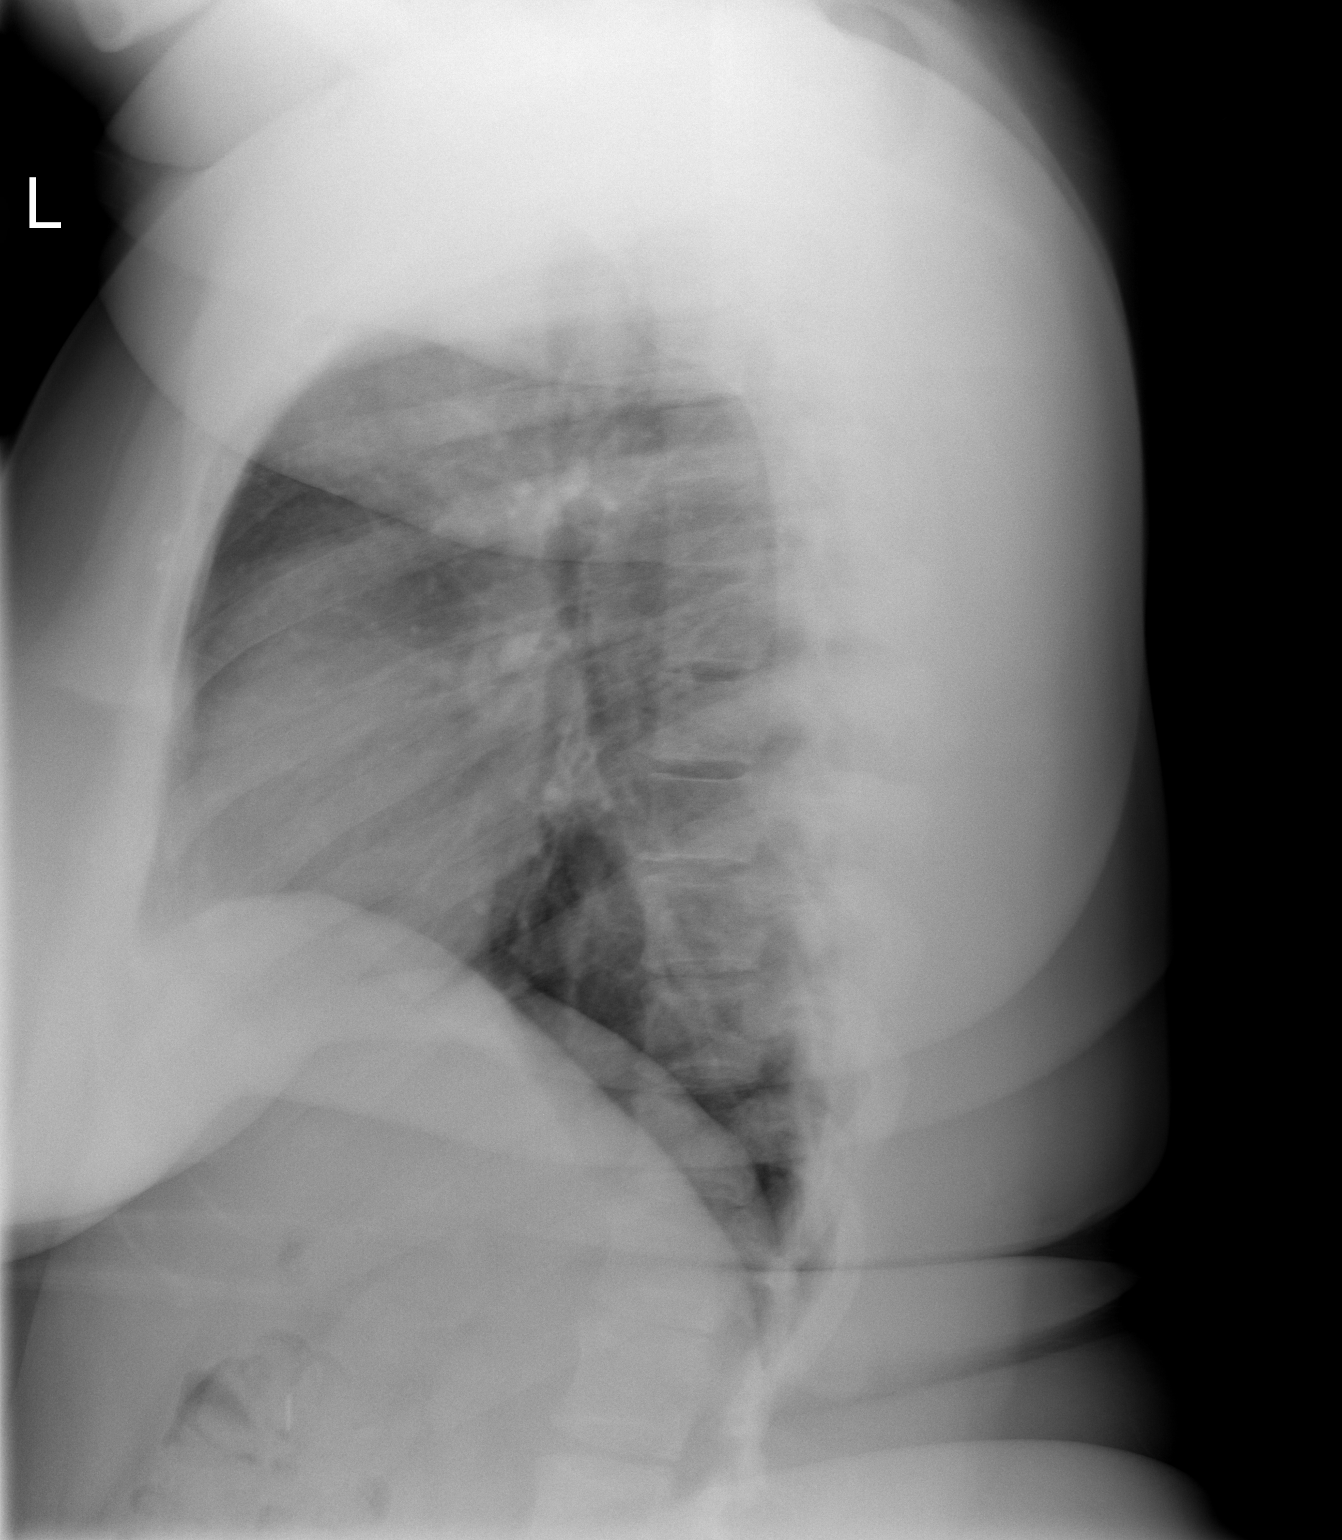

[2 of 2 positions shown; findings below may reference images not displayed]

FINDINGS: Large body habitus. Normal lung volumes. Normal cardiac size and
mediastinal contours. Visualized tracheal air column is within
normal limits. The lungs are clear. No pneumothorax or pleural
effusion. Stable cholecystectomy clips. No acute osseous abnormality
identified. Negative visible bowel gas pattern.
IMPRESSION: No acute cardiopulmonary abnormality.

## 2017-06-06 MED ORDER — ALBUTEROL SULFATE HFA 108 (90 BASE) MCG/ACT IN AERS
2.0000 | INHALATION_SPRAY | RESPIRATORY_TRACT | Status: DC | PRN
  Administered 2017-06-06: 2 via RESPIRATORY_TRACT
  Filled 2017-06-06: qty 6.7

## 2017-06-06 MED ORDER — OSELTAMIVIR PHOSPHATE 75 MG PO CAPS
75.0000 mg | ORAL_CAPSULE | Freq: Two times a day (BID) | ORAL | 0 refills | Status: DC
Start: 2017-06-06 — End: 2018-05-21

## 2017-06-06 MED ORDER — ACETAMINOPHEN 325 MG PO TABS
650.0000 mg | ORAL_TABLET | Freq: Once | ORAL | Status: AC | PRN
  Administered 2017-06-06: 650 mg via ORAL
  Filled 2017-06-06: qty 2

## 2017-06-06 NOTE — ED Provider Notes (Signed)
[facility] EMERGENCY DEPARTMENT Provider Note   CSN: [id number] Arrival date & time: 06/06/17  1433     History   Chief Complaint Chief Complaint  Patient presents with  . Cough    HPI Samantha Allen is a 22 y.o. female.  Patient is a 22 year old female with a history of asthma who presents with cough and cold symptoms.  She states that her symptoms started 2 days ago.  She reports runny nose congestion and coughing with diffuse myalgias.  She has a bifrontal headache.  She has a cough which is mostly nonproductive.  She denies any shortness of breath other than at night she feels like she has a lot of congestion in her throat which makes her feel short of breath.  No shortness of breath on ambulation.  She took Tylenol earlier today with some improvement in symptoms.  She has previously used an inhaler at home but states she lost it.  She denies any nausea or vomiting.      Past Medical History:  Diagnosis Date  . Asthma   . Pain from implanted hardware 11/2011   retained syndesmosis screw right ankle    Patient Active Problem List   Diagnosis Date Noted  . Anemia 04/07/2015  . Prediabetes 04/07/2015  . Obesity, morbid, BMI 50 or higher (HCC) 04/01/2015  . Abnormal uterine bleeding (AUB) 04/01/2015    Past Surgical History:  Procedure Laterality Date  . CHOLECYSTECTOMY    . HARDWARE REMOVAL  11/22/2011   Procedure: HARDWARE REMOVAL;  Surgeon: Toni Arthurs, MD;  Location: [facility];  Service: Orthopedics;  Laterality: Right;  . ORIF ANKLE FRACTURE  07/2011   right  . TONSILLECTOMY      OB History    No data available       Home Medications    Prior to Admission medications   Medication Sig Start Date End Date Taking? Authorizing Provider  albuterol (PROVENTIL HFA;VENTOLIN HFA) 108 (90 BASE) MCG/ACT inhaler Inhale 1-2 puffs into the lungs every 6 (six) hours as needed for wheezing or shortness of breath. 08/19/14   Marlon Pel, PA-C    albuterol (PROVENTIL) (5 MG/ML) 0.5% nebulizer solution Take 0.5 mLs (2.5 mg total) by nebulization every 6 (six) hours as needed for wheezing or shortness of breath. 08/19/14   Marlon Pel, PA-C  benzonatate (TESSALON) 100 MG capsule Take 1 capsule (100 mg total) by mouth every 8 (eight) hours. 12/27/15   Kirichenko, Tatyana, PA-C  ibuprofen (ADVIL,MOTRIN) 200 MG tablet Take 200 mg by mouth every 6 (six) hours as needed. Pain/ headache    [provider]  ibuprofen (ADVIL,MOTRIN) 600 MG tablet Take 1 tablet (600 mg total) by mouth every 6 (six) hours as needed. Patient not taking: Reported on 04/01/2015 09/24/14   Horton, Mayer Masker, MD  norethindrone-ethinyl estradiol-iron (MICROGESTIN FE,GILDESS FE,LOESTRIN FE) 1.5-30 MG-MCG tablet Take 1 tablet by mouth daily. 04/01/15   Dorna Leitz, PA-C  oseltamivir (TAMIFLU) 75 MG capsule Take 1 capsule (75 mg total) by mouth every 12 (twelve) hours. 06/06/17   Rolan Bucco, MD  predniSONE (DELTASONE) 20 MG tablet Take 2 tablets (40 mg total) by mouth daily. 12/27/15   Kirichenko, Lemont Fillers, PA-C  trimethoprim-polymyxin b (POLYTRIM) ophthalmic solution Place 2 drops into the left eye every 4 (four) hours. 07/31/15   Mabe, Latanya Maudlin, MD    Family History Family History  Problem Relation Age of Onset  . Anesthesia problems Mother  post-op N/V  . Hypertension Mother   . Diabetes Father     Social History Social History   Tobacco Use  . Smoking status: Current Every Day Smoker  . Smokeless tobacco: Never Used  Substance Use Topics  . Alcohol use: No  . Drug use: No     Allergies   Tuna [fish allergy]   Review of Systems Review of Systems  Constitutional: Positive for chills, fatigue and fever. Negative for diaphoresis.  HENT: Positive for congestion and rhinorrhea. Negative for sneezing.   Eyes: Negative.   Respiratory: Positive for cough. Negative for chest tightness and shortness of breath.   Cardiovascular: Negative for  chest pain and leg swelling.  Gastrointestinal: Negative for abdominal pain, blood in stool, diarrhea, nausea and vomiting.  Genitourinary: Negative for difficulty urinating, flank pain, frequency and hematuria.  Musculoskeletal: Positive for myalgias. Negative for arthralgias and back pain.  Skin: Negative for rash.  Neurological: Negative for dizziness, speech difficulty, weakness, numbness and headaches.     Physical Exam Updated Vital Signs BP 111/86 (BP Location: Left Arm)   Pulse (!) 124   Temp (!) 101.7 F (38.7 C) (Oral)   Resp (!) 26   Ht 5\' 4"  (1.626 m)   Wt 136.1 kg (300 lb)   LMP 05/16/2017   SpO2 99%   BMI 51.49 kg/m   Physical Exam  Constitutional: She is oriented to person, place, and time. She appears well-developed and well-nourished.  HENT:  Head: Normocephalic and atraumatic.  Right Ear: External ear normal.  Left Ear: External ear normal.  Mouth/Throat: Oropharynx is clear and moist.  Eyes: Conjunctivae are normal. Pupils are equal, round, and reactive to light.  Neck: Normal range of motion. Neck supple.  Cardiovascular: Normal rate, regular rhythm and normal heart sounds.  Pulmonary/Chest: Effort normal. No respiratory distress. She has wheezes (faint expiratory wheezes with cough only). She has no rales. She exhibits no tenderness.  Abdominal: Soft. Bowel sounds are normal. There is no tenderness. There is no rebound and no guarding.  Musculoskeletal: Normal range of motion. She exhibits no edema.  Lymphadenopathy:    She has no cervical adenopathy.  Neurological: She is alert and oriented to person, place, and time.  Skin: Skin is warm and dry. No rash noted.  Psychiatric: She has a normal mood and affect.     ED Treatments / Results  Labs (all labs ordered are listed, but only abnormal results are displayed) Labs Reviewed - No data to display  EKG  EKG Interpretation None       Radiology Dg Chest 2 View  Result Date:  06/06/2017 CLINICAL DATA:  22 year old female with cough, body ache, and headache for 2 days. EXAM: CHEST  2 VIEW COMPARISON:  12/27/2015 and earlier. FINDINGS: Large body habitus. Normal lung volumes. Normal cardiac size and mediastinal contours. Visualized tracheal air column is within normal limits. The lungs are clear. No pneumothorax or pleural effusion. Stable cholecystectomy clips. No acute osseous abnormality identified. Negative visible bowel gas pattern. IMPRESSION: No acute cardiopulmonary abnormality. Electronically Signed   By: Odessa FlemingH  Hall M.D.   On: 06/06/2017 15:05    Procedures Procedures (including critical care time)  Medications Ordered in ED Medications  albuterol (PROVENTIL HFA;VENTOLIN HFA) 108 (90 Base) MCG/ACT inhaler 2 puff (not administered)  acetaminophen (TYLENOL) tablet 650 mg (650 mg Oral Given 06/06/17 1459)     Initial Impression / Assessment and Plan / ED Course  I have reviewed the triage vital signs and the  nursing notes.  Pertinent labs & imaging results that were available during my care of the patient were reviewed by me and considered in my medical decision making (see chart for details).     Patient is a 22 year old female who presents with flulike symptoms.  There is no evidence of pneumonia on chest x-ray.  This was reviewed by me.  She otherwise is well-appearing.  Her heart rate was initially tachycardic but on my exam it was 89.  Her respirations have normalized.  She has no shortness of breath or hypoxia.  She was discharged home in good condition.  I did offer her Tamiflu and she does want a prescription for this.  She was also dispensed an albuterol inhaler to use as needed.  She was advised in symptomatic care.  Return precautions were given.  Final Clinical Impressions(s) / ED Diagnoses   Final diagnoses:  Influenza    ED Discharge Orders        Ordered    oseltamivir (TAMIFLU) 75 MG capsule  Every 12 hours     06/06/17 1521       Rolan Bucco, MD 06/06/17 1527

## 2017-06-06 NOTE — ED Triage Notes (Signed)
Patient states that she has had cough and cold like symptoms with generalized body aches x 3 days  - last medications for fever this am

## 2017-07-01 DIAGNOSIS — M25571 Pain in right ankle and joints of right foot: Secondary | ICD-10-CM | POA: Diagnosis not present

## 2017-08-24 ENCOUNTER — Encounter (HOSPITAL_COMMUNITY): Payer: Self-pay | Admitting: Emergency Medicine

## 2017-08-24 ENCOUNTER — Ambulatory Visit (HOSPITAL_COMMUNITY)
Admission: EM | Admit: 2017-08-24 | Discharge: 2017-08-24 | Disposition: A | Discharge status: Home / Self Care | Payer: 59 | Attending: Family Medicine | Admitting: Family Medicine

## 2017-08-24 DIAGNOSIS — J020 Streptococcal pharyngitis: Secondary | ICD-10-CM | POA: Diagnosis not present

## 2017-08-24 DIAGNOSIS — J302 Other seasonal allergic rhinitis: Secondary | ICD-10-CM

## 2017-08-24 LAB — POCT RAPID STREP A: Streptococcus, Group A Screen (Direct): POSITIVE — AB

## 2017-08-24 MED ORDER — AMOXICILLIN 500 MG PO CAPS: 500.0000 mg | ORAL_CAPSULE | Freq: Two times a day (BID) | ORAL | 0 refills | Status: AC

## 2017-08-24 MED ORDER — CETIRIZINE HCL 10 MG PO CHEW: 10.0000 mg | CHEWABLE_TABLET | Freq: Every day | ORAL | 0 refills | Status: DC

## 2017-08-24 MED ORDER — LIDOCAINE VISCOUS 2 % MT SOLN: OROMUCOSAL | 0 refills | Status: DC

## 2017-08-24 NOTE — ED Provider Notes (Signed)
[facility]   [id number] 08/24/17 Arrival Time: 1953  SUBJECTIVE: History from: patient.  Samantha Allen is a 22 y.o. female who presents with abrupt onset of sore throat for 2 days .  Admits to positive sick exposure or precipitating event.  She describes the pain as constant.  Has tried ibuprofen and throat spray without relief.  Symptoms are made worse with swallowing.  Denies previous symptoms in the past.   Complains of itchy eyes and cough.  Denies fever, chills, fatigue, sinus pain, rhinorrhea, sore throat, SOB, wheezing, chest pain, nausea, rash, changes in bowel or bladder habits.    ROS: As per HPI.  Past Medical History:  Diagnosis Date  . Asthma   . Pain from implanted hardware 11/2011   retained syndesmosis screw right ankle   Past Surgical History:  Procedure Laterality Date  . CHOLECYSTECTOMY    . HARDWARE REMOVAL  11/22/2011   Procedure: HARDWARE REMOVAL;  Surgeon: Toni Arthurs, MD;  Location: [facility];  Service: Orthopedics;  Laterality: Right;  . ORIF ANKLE FRACTURE  07/2011   right  . TONSILLECTOMY     Allergies  Allergen Reactions  . [place] Gum [Fish Allergy] Anaphylaxis   Outpatient Medications   albuterol (PROVENTIL HFA;VENTOLIN HFA) 108 (90 BASE) MCG/ACT inhaler    albuterol (PROVENTIL) (5 MG/ML) 0.5% nebulizer solution    benzonatate (TESSALON) 100 MG capsule    ibuprofen (ADVIL,MOTRIN) 200 MG tablet    ibuprofen (ADVIL,MOTRIN) 600 MG tablet    norethindrone-ethinyl estradiol-iron (MICROGESTIN FE,GILDESS FE,LOESTRIN FE) 1.5-30 MG-MCG tablet    oseltamivir (TAMIFLU) 75 MG capsule    predniSONE (DELTASONE) 20 MG tablet    trimethoprim-polymyxin b (POLYTRIM) ophthalmic solution     Social History   Socioeconomic History  . Marital status: Single    Spouse name: Not on file  . Number of children: Not on file  . Years of education: Not on file  . Highest education level: Not on file  Occupational History  . Not on file  Social  Needs  . Financial resource strain: Not on file  . Food insecurity:    Worry: Not on file    Inability: Not on file  . Transportation needs:    Medical: Not on file    Non-medical: Not on file  Tobacco Use  . Smoking status: Current Every Day Smoker  . Smokeless tobacco: Never Used  Substance and Sexual Activity  . Alcohol use: No  . Drug use: No  . Sexual activity: Not on file  Lifestyle  . Physical activity:    Days per week: Not on file    Minutes per session: Not on file  . Stress: Not on file  Relationships  . Social connections:    Talks on phone: Not on file    Gets together: Not on file    Attends religious service: Not on file    Active member of club or organization: Not on file    Attends meetings of clubs or organizations: Not on file    Relationship status: Not on file  . Intimate partner violence:    Fear of current or ex partner: Not on file    Emotionally abused: Not on file    Physically abused: Not on file    Forced sexual activity: Not on file  Other Topics Concern  . Not on file  Social History Narrative  . Not on file   Family History  Problem Relation Age of Onset  . Anesthesia problems  Mother        post-op N/V  . Hypertension Mother   . Diabetes Father     OBJECTIVE:  Vitals:   08/24/17 2032  BP: (!) 155/99  Pulse: 94  Resp: 18  Temp: 98.6 F (37 C)  TempSrc: Oral  SpO2: 100%     General appearance: alert; appears fatigued HEENT: Ears: EACs clear, TMs pearly gray with visible cone of light, without erythema; Eyes: PERRL, EOMI grossly; Sinuses nontender to palpation; Nose: minimal clear rhinorrhea; Throat: oropharynx mildly erythematous, tonsils 1+ without white tonsillar exudates, uvula midline Neck: supple without LAD Lungs: CTA bilaterally without adventitious breath soudns Heart: regular rate and rhythm.  Radial pulses 2+ symmetrical bilaterally Skin: warm and dry Psychological: alert and cooperative; normal mood and  affect   ASSESSMENT & PLAN:  1. Strep pharyngitis   2. Seasonal allergies     Meds ordered this encounter  Medications  . amoxicillin (AMOXIL) 500 MG capsule    Sig: Take 1 capsule (500 mg total) by mouth 2 (two) times daily for 10 days.    Dispense:  20 capsule    Refill:  0    Order Specific Question:   Supervising Provider    Answer:   Isa Rankin [phone]  . cetirizine (ZYRTEC) 10 MG chewable tablet    Sig: Chew 1 tablet (10 mg total) by mouth daily.    Dispense:  30 tablet    Refill:  0    Order Specific Question:   Supervising Provider    Answer:   Isa Rankin [phone]  . lidocaine (XYLOCAINE) 2 % solution    Sig: 15 mL gargled and may be swallowed no more often than every 3 hours as needed    Dispense:  100 mL    Refill:  0    Order Specific Question:   Supervising Provider    Answer:   Isa Rankin [[id number]]    Strep was positive.  Drink plenty of water and rest Prescribed amoxicillin  twice daily for 10 days.  Take as directed and to completion.  Perform salt water gargles at home, throat lozenges Lidocaine mouth wash prescribed Zyrtec prescribed Follow up with PCP if symptoms persist Return or go to ER if you have any new or worsening symptoms    Reviewed expectations re: course of current medical issues. Questions answered. Outlined signs and symptoms indicating need for more acute intervention. Patient verbalized understanding. After Visit Summary given.         Rennis Harding, PA-C 08/24/17 2116

## 2017-08-24 NOTE — ED Triage Notes (Signed)
Pt sts sore throat x 2 days  

## 2017-08-24 NOTE — Discharge Instructions (Addendum)
Strep was positive.  Drink plenty of water and rest Prescribed amoxicillin  twice daily for 10 days.  Take as directed and to completion.  Perform salt water gargles at home, throat lozenges Lidocaine mouth wash prescribed Zyrtec prescribed Follow up with PCP if symptoms persist Return or go to ER if you have any new or worsening symptoms

## 2017-10-02 ENCOUNTER — Emergency Department (HOSPITAL_COMMUNITY)
Admission: EM | Admit: 2017-10-02 | Discharge: 2017-10-02 | Disposition: A | Discharge status: Home / Self Care | Payer: 59 | Attending: Emergency Medicine | Admitting: Emergency Medicine

## 2017-10-02 ENCOUNTER — Encounter (HOSPITAL_COMMUNITY): Payer: Self-pay

## 2017-10-02 ENCOUNTER — Emergency Department (HOSPITAL_COMMUNITY): Payer: 59

## 2017-10-02 DIAGNOSIS — N760 Acute vaginitis: Secondary | ICD-10-CM | POA: Insufficient documentation

## 2017-10-02 DIAGNOSIS — B9689 Other specified bacterial agents as the cause of diseases classified elsewhere: Secondary | ICD-10-CM | POA: Diagnosis not present

## 2017-10-02 DIAGNOSIS — N938 Other specified abnormal uterine and vaginal bleeding: Secondary | ICD-10-CM

## 2017-10-02 DIAGNOSIS — J45909 Unspecified asthma, uncomplicated: Secondary | ICD-10-CM | POA: Diagnosis not present

## 2017-10-02 DIAGNOSIS — F17200 Nicotine dependence, unspecified, uncomplicated: Secondary | ICD-10-CM | POA: Diagnosis not present

## 2017-10-02 DIAGNOSIS — D649 Anemia, unspecified: Secondary | ICD-10-CM

## 2017-10-02 DIAGNOSIS — Z79899 Other long term (current) drug therapy: Secondary | ICD-10-CM | POA: Diagnosis not present

## 2017-10-02 DIAGNOSIS — R102 Pelvic and perineal pain: Secondary | ICD-10-CM | POA: Diagnosis present

## 2017-10-02 LAB — URINALYSIS, ROUTINE W REFLEX MICROSCOPIC
Bilirubin Urine: NEGATIVE
Glucose, UA: NEGATIVE mg/dL
Hgb urine dipstick: NEGATIVE
Ketones, ur: NEGATIVE mg/dL
Leukocytes, UA: NEGATIVE
Nitrite: NEGATIVE
Protein, ur: NEGATIVE mg/dL
Specific Gravity, Urine: 1.015 (ref 1.005–1.030)
pH: 6 (ref 5.0–8.0)

## 2017-10-02 LAB — WET PREP, GENITAL
Sperm: NONE SEEN
Trich, Wet Prep: NONE SEEN
Yeast Wet Prep HPF POC: NONE SEEN

## 2017-10-02 LAB — CBC WITH DIFFERENTIAL/PLATELET
Abs Immature Granulocytes: 0 10*3/uL (ref 0.0–0.1)
Basophils Absolute: 0 10*3/uL (ref 0.0–0.1)
Basophils Relative: 0 %
Eosinophils Absolute: 0.2 10*3/uL (ref 0.0–0.7)
Eosinophils Relative: 3 %
HCT: 33.3 % — ABNORMAL LOW (ref 36.0–46.0)
Hemoglobin: 9.4 g/dL — ABNORMAL LOW (ref 12.0–15.0)
Immature Granulocytes: 0 %
Lymphocytes Relative: 36 %
Lymphs Abs: 2.6 10*3/uL (ref 0.7–4.0)
MCH: 20 pg — ABNORMAL LOW (ref 26.0–34.0)
MCHC: 28.2 g/dL — ABNORMAL LOW (ref 30.0–36.0)
MCV: 70.7 fL — ABNORMAL LOW (ref 78.0–100.0)
Monocytes Absolute: 0.4 10*3/uL (ref 0.1–1.0)
Monocytes Relative: 5 %
Neutro Abs: 4 10*3/uL (ref 1.7–7.7)
Neutrophils Relative %: 56 %
Platelets: 452 10*3/uL — ABNORMAL HIGH (ref 150–400)
RBC: 4.71 MIL/uL (ref 3.87–5.11)
RDW: 20.3 % — ABNORMAL HIGH (ref 11.5–15.5)
WBC: 7.2 10*3/uL (ref 4.0–10.5)

## 2017-10-02 LAB — BASIC METABOLIC PANEL
Anion gap: 7 (ref 5–15)
BUN: 5 mg/dL — ABNORMAL LOW (ref 6–20)
CO2: 23 mmol/L (ref 22–32)
Calcium: 8.7 mg/dL — ABNORMAL LOW (ref 8.9–10.3)
Chloride: 109 mmol/L (ref 101–111)
Creatinine, Ser: 0.79 mg/dL (ref 0.44–1.00)
GFR calc Af Amer: >60 mL/min (ref >60)
GFR calc non Af Amer: >60 mL/min (ref >60)
Glucose, Bld: 116 mg/dL — ABNORMAL HIGH (ref 65–99)
Potassium: 3.5 mmol/L (ref 3.5–5.1)
Sodium: 139 mmol/L (ref 135–145)

## 2017-10-02 LAB — URINALYSIS, MICROSCOPIC (REFLEX): RBC / HPF: >50 RBC/hpf (ref 0–5)

## 2017-10-02 MED ORDER — NAPROXEN 250 MG PO TABS: 250.0000 mg | ORAL_TABLET | Freq: Two times a day (BID) | ORAL | 1 refills | Status: DC | PRN

## 2017-10-02 MED ORDER — METRONIDAZOLE 500 MG PO TABS: 500.0000 mg | ORAL_TABLET | Freq: Two times a day (BID) | ORAL | 0 refills | Status: DC

## 2017-10-02 MED ORDER — FERROUS SULFATE ER 142 (45 FE) MG PO TBCR: 142.0000 mg | EXTENDED_RELEASE_TABLET | Freq: Every day | ORAL | 2 refills | Status: DC

## 2017-10-02 NOTE — ED Provider Notes (Signed)
[facility] EMERGENCY DEPARTMENT Provider Note   CSN: [id number] Arrival date & time: 10/02/17  1441     History   Chief Complaint Chief Complaint  Patient presents with  . Pelvic Pain  . Flank Pain    HPI Samantha Allen is a 22 y.o. female presents the emergency department with chief complaint of pelvic and back pain.  Patient states that she has had cramping and heavy bleeding before however her pain today is severe and she is never had pain this bad.  She has a history of heavy menstrual bleeding and began menstruation today.  Her girlfriend who is at bedside states that about 3 days ago she was complaining like she felt she was getting sick.  She denies any dysuria, frequency, urgency, or other symptoms of urinary tract infection.  She denies fevers or chills.  She is sexually active with her single female partner.  HPI  Past Medical History:  Diagnosis Date  . Asthma   . Pain from implanted hardware 11/2011   retained syndesmosis screw right ankle    Patient Active Problem List   Diagnosis Date Noted  . Anemia 04/07/2015  . Prediabetes 04/07/2015  . Obesity, morbid, BMI 50 or higher (HCC) 04/01/2015  . Abnormal uterine bleeding (AUB) 04/01/2015    Past Surgical History:  Procedure Laterality Date  . CHOLECYSTECTOMY    . HARDWARE REMOVAL  11/22/2011   Procedure: HARDWARE REMOVAL;  Surgeon: Toni Arthurs, MD;  Location: [facility];  Service: Orthopedics;  Laterality: Right;  . ORIF ANKLE FRACTURE  07/2011   right  . TONSILLECTOMY       OB History   None      Home Medications    Prior to Admission medications   Medication Sig Start Date End Date Taking? Authorizing Provider  albuterol (PROVENTIL HFA;VENTOLIN HFA) 108 (90 BASE) MCG/ACT inhaler Inhale 1-2 puffs into the lungs every 6 (six) hours as needed for wheezing or shortness of breath. 08/19/14  Yes Neva Seat, Tiffany, PA-C  ibuprofen (ADVIL,MOTRIN) 200 MG tablet Take 200 mg by mouth  every 6 (six) hours as needed. Pain/ headache   Yes [provider]  albuterol (PROVENTIL) (5 MG/ML) 0.5% nebulizer solution Take 0.5 mLs (2.5 mg total) by nebulization every 6 (six) hours as needed for wheezing or shortness of breath. Patient not taking: Reported on 10/02/2017 08/19/14   Marlon Pel, PA-C  benzonatate (TESSALON) 100 MG capsule Take 1 capsule (100 mg total) by mouth every 8 (eight) hours. Patient not taking: Reported on 10/02/2017 12/27/15   Jaynie Crumble, PA-C  cetirizine (ZYRTEC) 10 MG chewable tablet Chew 1 tablet (10 mg total) by mouth daily. Patient not taking: Reported on 10/02/2017 08/24/17   Wurst, [place], PA-C  ibuprofen (ADVIL,MOTRIN) 600 MG tablet Take 1 tablet (600 mg total) by mouth every 6 (six) hours as needed. Patient not taking: Reported on 04/01/2015 09/24/14   Horton, Mayer Masker, MD  lidocaine (XYLOCAINE) 2 % solution 15 mL gargled and may be swallowed no more often than every 3 hours as needed Patient not taking: Reported on 10/02/2017 08/24/17   Wurst, [place], PA-C  norethindrone-ethinyl estradiol-iron (MICROGESTIN FE,GILDESS FE,LOESTRIN FE) 1.5-30 MG-MCG tablet Take 1 tablet by mouth daily. Patient not taking: Reported on 10/02/2017 04/01/15   Dorna Leitz, PA-C  oseltamivir (TAMIFLU) 75 MG capsule Take 1 capsule (75 mg total) by mouth every 12 (twelve) hours. Patient not taking: Reported on 10/02/2017 06/06/17   Rolan Bucco, MD  predniSONE (DELTASONE)  20 MG tablet Take 2 tablets (40 mg total) by mouth daily. Patient not taking: Reported on 10/02/2017 12/27/15   Jaynie Crumble, PA-C  trimethoprim-polymyxin b (POLYTRIM) ophthalmic solution Place 2 drops into the left eye every 4 (four) hours. Patient not taking: Reported on 10/02/2017 07/31/15   Mabe, Latanya Maudlin, MD    Family History Family History  Problem Relation Age of Onset  . Anesthesia problems Mother        post-op N/V  . Hypertension Mother   . Diabetes Father     Social  History Social History   Tobacco Use  . Smoking status: Current Every Day Smoker  . Smokeless tobacco: Never Used  Substance Use Topics  . Alcohol use: No  . Drug use: No     Allergies   Tuna [fish allergy]   Review of Systems Review of Systems Ten systems reviewed and are negative for acute change, except as noted in the HPI.    Physical Exam Updated Vital Signs BP (!) 162/94 (BP Location: Right Arm)   Pulse 87   Temp 98.8 F (37.1 C) (Oral)   Resp 20   LMP 10/02/2017 (Exact Date)   SpO2 99%   Physical Exam  Constitutional: She is oriented to person, place, and time. She appears well-developed and well-nourished. No distress.  HENT:  Head: Normocephalic and atraumatic.  Eyes: Pupils are equal, round, and reactive to light. Conjunctivae and EOM are normal. No scleral icterus.  Neck: Normal range of motion.  Cardiovascular: Normal rate, regular rhythm and normal heart sounds. Exam reveals no gallop and no friction rub.  No murmur heard. Pulmonary/Chest: Effort normal and breath sounds normal. No respiratory distress.  Abdominal: Soft. Bowel sounds are normal. She exhibits no distension and no mass. There is no tenderness. There is no guarding.  No CVA tenderness  Genitourinary:  Genitourinary Comments: Normal external female genitalia Vaginal vault is full of blood which appears to be coming from the cervical loss at steady rate of flow.  Vaginal walls are pink and healthy without overt vaginal discharge.  No cervical motion tenderness or adnexal pain.  Patient is tender to palpation of the anterior vaginal wall toward the bladder.  Neurological: She is alert and oriented to person, place, and time.  Skin: Skin is warm and dry. She is not diaphoretic.  Psychiatric: Her behavior is normal.  Nursing note and vitals reviewed.    ED Treatments / Results  Labs (all labs ordered are listed, but only abnormal results are displayed) Labs Reviewed  BASIC METABOLIC PANEL  - Abnormal; Notable for the following components:      Result Value   Glucose, Bld 116 (*)    BUN 5 (*)    Calcium 8.7 (*)    All other components within normal limits  CBC WITH DIFFERENTIAL/PLATELET - Abnormal; Notable for the following components:   Hemoglobin 9.4 (*)    HCT 33.3 (*)    MCV 70.7 (*)    MCH 20.0 (*)    MCHC 28.2 (*)    RDW 20.3 (*)    Platelets 452 (*)    All other components within normal limits  URINALYSIS, ROUTINE W REFLEX MICROSCOPIC - Abnormal; Notable for the following components:   Color, Urine RED (*)    APPearance CLOUDY (*)    Glucose, UA   (*)    Value: TEST NOT REPORTED DUE TO COLOR INTERFERENCE OF URINE PIGMENT   Hgb urine dipstick   (*)    Value:  TEST NOT REPORTED DUE TO COLOR INTERFERENCE OF URINE PIGMENT   Bilirubin Urine   (*)    Value: TEST NOT REPORTED DUE TO COLOR INTERFERENCE OF URINE PIGMENT   Ketones, ur   (*)    Value: TEST NOT REPORTED DUE TO COLOR INTERFERENCE OF URINE PIGMENT   Protein, ur   (*)    Value: TEST NOT REPORTED DUE TO COLOR INTERFERENCE OF URINE PIGMENT   Nitrite   (*)    Value: TEST NOT REPORTED DUE TO COLOR INTERFERENCE OF URINE PIGMENT   Leukocytes, UA   (*)    Value: TEST NOT REPORTED DUE TO COLOR INTERFERENCE OF URINE PIGMENT   All other components within normal limits  URINALYSIS, MICROSCOPIC (REFLEX) - Abnormal; Notable for the following components:   Bacteria, UA MANY (*)    All other components within normal limits  WET PREP, GENITAL  RPR  HIV ANTIBODY (ROUTINE TESTING)  URINALYSIS, ROUTINE W REFLEX MICROSCOPIC  GC/CHLAMYDIA PROBE AMP ([place]) [facility]    EKG None  Radiology No results found.  Procedures BLADDER CATHETERIZATION Date/Time: 10/02/2017 5:34 PM Performed by: Arthor CaptainHarris, Nykolas Bacallao, PA-C Authorized by: Arthor CaptainHarris, Kimiyah Blick, PA-C   Consent:    Consent obtained:  Verbal   Consent given by:  Patient   Risks discussed:  Infection, urethral injury and pain Pre-procedure details:     Procedure purpose:  Diagnostic   Preparation: Patient was prepped and draped in usual sterile fashion   Procedure details:    Procedure performed by provider due to: Poor specimen.   Catheter insertion: In and Out.   Number of attempts:  1   Urine characteristics:  Clear Post-procedure details:    Patient tolerance of procedure:  Tolerated well, no immediate complications   (including critical care time)  Medications Ordered in ED Medications - No data to display   Initial Impression / Assessment and Plan / ED Course  I have reviewed the triage vital signs and the nursing notes.  Pertinent labs & imaging results that were available during my care of the patient were reviewed by me and considered in my medical decision making (see chart for details).   Patient with anemia.  Likely secondary to her chronic bleeding.  Her ultrasound is negative and her wet prep shows some BV.  Patient will be discharged with Flagyl.  I recommended OTC boric acid vaginal suppositories for continued vaginal bleeding and follow-up with OB/GYN.  I have no concern for underlying STI.  Catheterized urine is negative for any evidence of infection.  Patient appears appropriate for discharge at this time.  Final Clinical Impressions(s) / ED Diagnoses   Final diagnoses:  Dysfunctional uterine bleeding  BV (bacterial vaginosis)  Anemia, unspecified type    ED Discharge Orders    None       Arthor CaptainHarris, Marquesha Robideau, PA-C 10/02/17 2354    Tilden Fossaees, Elizabeth, MD 10/06/17 [phone]

## 2017-10-02 NOTE — ED Notes (Signed)
Patient transported to Ultrasound 

## 2017-10-02 NOTE — ED Notes (Signed)
Returned from U/S

## 2017-10-02 NOTE — ED Triage Notes (Signed)
Pt presents for evaluation of lower back/flank pain bilaterally and lower abd pain bilaterally. Pt reports LMP started today, has had heavy bleeding (3 pads today). Reports has seen doctors for pain before. Pt is very tearful, had to leave work today due to pain.

## 2017-10-02 NOTE — Discharge Instructions (Addendum)
Go to [employer].  Get the boric acid suppositories.  Insert 1 suppository into your vagina at bedtime. Do this every other night for 3 nights this week. After that you may use it 1-2 times a week as needed for signs or symptoms of BV. You may notice a large amount of water from the vagina when you stand or use the bathroom after using the suppository.   Contact a health care provider if: You get light-headed or weak. You have nausea and vomiting. You cannot eat or drink without vomiting. You feel dizzy or have diarrhea while you are taking medicines. You are taking birth control pills or hormones, and you want to change them or stop taking them. Get help right away if: You develop a fever or chills. You need to change your sanitary pad or tampon more than one time per hour. Your bleeding becomes heavier, or your flow contains clots more often. You develop pain in your abdomen. You lose consciousness. You develop a rash.

## 2017-10-02 NOTE — ED Provider Notes (Signed)
Patient placed in Quick Look pathway, seen and evaluated   Chief Complaint: Pelvic pain, back pain  HPI:   22 year old female presents with worsening pelvic/back pain. It is chronic and been going on for years but worse recently. She has abnormal periods and pain is worse when she is on her periods. She's had heavy bleeding for the past couple months. She denies fever, vomiting, change in urination. She's had some vaginal discharge. She's tried BC pills in the past which have not helped.  ROS: +pelvic pain, +vaginal bleeding  Physical Exam:   Gen: No distress, obese  Neuro: Awake and Alert  Skin: Warm    Focused Exam: Heart: regular rate and rhythm    Lungs: CTA    Abdomen: Soft, generally tender over lower abodmen   Initiation of care has begun. The patient has been counseled on the process, plan, and necessity for staying for the completion/evaluation, and the remainder of the medical screening examination    Bethel BornGekas, Damar Petit Marie, PA-C 10/02/17 1518    Jacalyn LefevreHaviland, Julie, MD 10/02/17 [phone]

## 2017-10-03 LAB — URINE CULTURE: Culture: NO GROWTH

## 2017-10-03 LAB — GC/CHLAMYDIA PROBE AMP (~~LOC~~) NOT AT ARMC
Chlamydia: NEGATIVE
Neisseria Gonorrhea: NEGATIVE

## 2017-10-03 LAB — RPR: RPR Ser Ql: NONREACTIVE

## 2017-10-03 LAB — HIV ANTIBODY (ROUTINE TESTING W REFLEX): HIV Screen 4th Generation wRfx: NONREACTIVE

## 2017-11-13 DIAGNOSIS — S338XXA Sprain of other parts of lumbar spine and pelvis, initial encounter: Secondary | ICD-10-CM | POA: Diagnosis not present

## 2018-05-20 ENCOUNTER — Emergency Department (HOSPITAL_COMMUNITY)
Admission: EM | Admit: 2018-05-20 | Discharge: 2018-05-21 | Disposition: A | Discharge status: Home / Self Care | Payer: 59 | Attending: Emergency Medicine | Admitting: Emergency Medicine

## 2018-05-20 ENCOUNTER — Other Ambulatory Visit: Payer: Self-pay

## 2018-05-20 ENCOUNTER — Encounter (HOSPITAL_COMMUNITY): Payer: Self-pay | Admitting: Emergency Medicine

## 2018-05-20 DIAGNOSIS — R030 Elevated blood-pressure reading, without diagnosis of hypertension: Secondary | ICD-10-CM | POA: Insufficient documentation

## 2018-05-20 DIAGNOSIS — Z79899 Other long term (current) drug therapy: Secondary | ICD-10-CM | POA: Insufficient documentation

## 2018-05-20 DIAGNOSIS — F17200 Nicotine dependence, unspecified, uncomplicated: Secondary | ICD-10-CM | POA: Diagnosis not present

## 2018-05-20 DIAGNOSIS — K529 Noninfective gastroenteritis and colitis, unspecified: Secondary | ICD-10-CM

## 2018-05-20 DIAGNOSIS — J45909 Unspecified asthma, uncomplicated: Secondary | ICD-10-CM | POA: Insufficient documentation

## 2018-05-20 DIAGNOSIS — R112 Nausea with vomiting, unspecified: Secondary | ICD-10-CM | POA: Diagnosis present

## 2018-05-20 LAB — CBC
HCT: 32.5 % — ABNORMAL LOW (ref 36.0–46.0)
Hemoglobin: 9.2 g/dL — ABNORMAL LOW (ref 12.0–15.0)
MCH: 19.2 pg — ABNORMAL LOW (ref 26.0–34.0)
MCHC: 28.3 g/dL — ABNORMAL LOW (ref 30.0–36.0)
MCV: 68 fL — ABNORMAL LOW (ref 80.0–100.0)
Platelets: 405 10*3/uL — ABNORMAL HIGH (ref 150–400)
RBC: 4.78 MIL/uL (ref 3.87–5.11)
RDW: 19.9 % — ABNORMAL HIGH (ref 11.5–15.5)
WBC: 9.9 10*3/uL (ref 4.0–10.5)
nRBC: 0 % (ref 0.0–0.2)

## 2018-05-20 LAB — COMPREHENSIVE METABOLIC PANEL
ALT: 22 U/L (ref 0–44)
AST: 23 U/L (ref 15–41)
Albumin: 3.7 g/dL (ref 3.5–5.0)
Alkaline Phosphatase: 96 U/L (ref 38–126)
Anion gap: 6 (ref 5–15)
BUN: 10 mg/dL (ref 6–20)
CO2: 25 mmol/L (ref 22–32)
Calcium: 8.9 mg/dL (ref 8.9–10.3)
Chloride: 107 mmol/L (ref 98–111)
Creatinine, Ser: 0.85 mg/dL (ref 0.44–1.00)
GFR calc Af Amer: >60 mL/min (ref >60)
GFR calc non Af Amer: >60 mL/min (ref >60)
Glucose, Bld: 145 mg/dL — ABNORMAL HIGH (ref 70–99)
Potassium: 3.5 mmol/L (ref 3.5–5.1)
Sodium: 138 mmol/L (ref 135–145)
Total Bilirubin: 0.6 mg/dL (ref 0.3–1.2)
Total Protein: 8.3 g/dL — ABNORMAL HIGH (ref 6.5–8.1)

## 2018-05-20 LAB — I-STAT BETA HCG BLOOD, ED (MC, WL, AP ONLY): I-stat hCG, quantitative: <5 m[IU]/mL (ref <5)

## 2018-05-20 LAB — LIPASE, BLOOD: Lipase: 29 U/L (ref 11–51)

## 2018-05-20 MED ORDER — SODIUM CHLORIDE 0.9 % IV BOLUS
1000.0000 mL | Freq: Once | INTRAVENOUS | Status: AC
  Administered 2018-05-20: 1000 mL via INTRAVENOUS

## 2018-05-20 MED ORDER — SODIUM CHLORIDE 0.9% FLUSH
3.0000 mL | Freq: Once | INTRAVENOUS | Status: AC
  Administered 2018-05-20: 3 mL via INTRAVENOUS

## 2018-05-20 MED ORDER — ONDANSETRON HCL 4 MG/2ML IJ SOLN
4.0000 mg | Freq: Once | INTRAMUSCULAR | Status: AC
  Administered 2018-05-20: 4 mg via INTRAVENOUS
  Filled 2018-05-20: qty 2

## 2018-05-20 NOTE — ED Notes (Signed)
RN made aware of pts elevated BP 

## 2018-05-20 NOTE — ED Triage Notes (Signed)
Patient c/o N/V/D with generalized abdominal pain since last night.

## 2018-05-20 NOTE — ED Notes (Signed)
Pt sts she was unaware a sample was needed.  Pt went to restroom already but did not provide sample.  Sts she will give one next time she has to go..  RN notified.

## 2018-05-20 NOTE — ED Provider Notes (Signed)
[facility] Provider Note: Samantha Dell, MD, FACEP  CSN: [id number] MRN: [id number] ARRIVAL: 05/20/18 at 2049 ROOM: WA13/WA13   CHIEF COMPLAINT  Vomiting   HISTORY OF PRESENT ILLNESS  05/20/18 11:14 PM Samantha Allen is a 23 y.o. female with nausea, vomiting, diarrhea and abdominal discomfort since yesterday evening.  The abdominal discomfort is mild and she describes it as "an upset stomach".  She has vomited several times and has had diarrhea frequently.  She has been able to keep NyQuil, DayQuil and Tylenol down but has not been able to eat or drink.  She had a fever yesterday evening but none since.  She denies generalized weakness.   Past Medical History:  Diagnosis Date  . Asthma   . Pain from implanted hardware 11/2011   retained syndesmosis screw right ankle    Past Surgical History:  Procedure Laterality Date  . CHOLECYSTECTOMY    . HARDWARE REMOVAL  11/22/2011   Procedure: HARDWARE REMOVAL;  Surgeon: Toni Arthurs, MD;  Location: [facility];  Service: Orthopedics;  Laterality: Right;  . ORIF ANKLE FRACTURE  07/2011   right  . TONSILLECTOMY      Family History  Problem Relation Age of Onset  . Anesthesia problems Mother        post-op N/V  . Hypertension Mother   . Diabetes Father     Social History   Tobacco Use  . Smoking status: Current Every Day Smoker  . Smokeless tobacco: Never Used  Substance Use Topics  . Alcohol use: No  . Drug use: No    Prior to Admission medications   Medication Sig Start Date End Date Taking? Authorizing Provider  albuterol (PROVENTIL HFA;VENTOLIN HFA) 108 (90 BASE) MCG/ACT inhaler Inhale 1-2 puffs into the lungs every 6 (six) hours as needed for wheezing or shortness of breath. 08/19/14  Yes Neva Seat, Tiffany, PA-C  ibuprofen (ADVIL,MOTRIN) 200 MG tablet Take 200 mg by mouth every 6 (six) hours as needed. Pain/ headache   Yes [provider]  albuterol (PROVENTIL) (5 MG/ML) 0.5% nebulizer solution  Take 0.5 mLs (2.5 mg total) by nebulization every 6 (six) hours as needed for wheezing or shortness of breath. Patient not taking: Reported on 10/02/2017 08/19/14   Marlon Pel, PA-C  benzonatate (TESSALON) 100 MG capsule Take 1 capsule (100 mg total) by mouth every 8 (eight) hours. Patient not taking: Reported on 10/02/2017 12/27/15   Jaynie Crumble, PA-C  cetirizine (ZYRTEC) 10 MG chewable tablet Chew 1 tablet (10 mg total) by mouth daily. Patient not taking: Reported on 10/02/2017 08/24/17   Wurst, [place], PA-C  Ferrous Sulfate (SLOW FE) 142 (45 Fe) MG TBCR Take 142 mg by mouth daily. Patient not taking: Reported on 05/20/2018 10/02/17   Arthor Captain, PA-C  ibuprofen (ADVIL,MOTRIN) 600 MG tablet Take 1 tablet (600 mg total) by mouth every 6 (six) hours as needed. Patient not taking: Reported on 04/01/2015 09/24/14   Horton, Mayer Masker, MD  lidocaine (XYLOCAINE) 2 % solution 15 mL gargled and may be swallowed no more often than every 3 hours as needed Patient not taking: Reported on 10/02/2017 08/24/17   Wurst, [place], PA-C  metroNIDAZOLE (FLAGYL) 500 MG tablet Take 1 tablet (500 mg total) by mouth 2 (two) times daily. One po bid x 7 days Patient not taking: Reported on 05/20/2018 10/02/17   Arthor Captain, PA-C  naproxen (NAPROSYN) 250 MG tablet Take 1-2 tablets (250-500 mg total) by mouth 2 (two) times daily as needed (Menstrual cramps,  joint aches, back pain). Patient not taking: Reported on 05/20/2018 10/02/17   Arthor CaptainHarris, Abigail, PA-C  norethindrone-ethinyl estradiol-iron (MICROGESTIN FE,GILDESS FE,LOESTRIN FE) 1.5-30 MG-MCG tablet Take 1 tablet by mouth daily. Patient not taking: Reported on 10/02/2017 04/01/15   Dorna LeitzBush, Nicole V, PA-C  oseltamivir (TAMIFLU) 75 MG capsule Take 1 capsule (75 mg total) by mouth every 12 (twelve) hours. Patient not taking: Reported on 10/02/2017 06/06/17   Rolan BuccoBelfi, Melanie, MD  predniSONE (DELTASONE) 20 MG tablet Take 2 tablets (40 mg total) by mouth daily. Patient  not taking: Reported on 10/02/2017 12/27/15   Jaynie CrumbleKirichenko, Tatyana, PA-C  trimethoprim-polymyxin b (POLYTRIM) ophthalmic solution Place 2 drops into the left eye every 4 (four) hours. Patient not taking: Reported on 10/02/2017 07/31/15   Phillis HaggisMabe, Martha L, MD    Allergies Callender Binguna [fish allergy]   REVIEW OF SYSTEMS  Negative except as noted here or in the History of Present Illness.   PHYSICAL EXAMINATION  Initial Vital Signs Blood pressure (!) 154/109, pulse (!) 114, temperature 98.9 F (37.2 C), temperature source Oral, resp. rate 15, height 5\' 4"  (1.626 m), weight 131.1 kg, SpO2 98 %.  Examination General: Well-developed, obese female in no acute distress; appearance consistent with age of record HENT: normocephalic; atraumatic Eyes: pupils equal, round and reactive to light; extraocular muscles intact Neck: supple Heart: regular rate and rhythm; tachycardia Lungs: clear to auscultation bilaterally Abdomen: soft; nondistended; nontender; bowel sounds present Extremities: No deformity; full range of motion; pulses normal Neurologic: Awake, alert and oriented; motor function intact in all extremities and symmetric; no facial droop Skin: Warm and dry Psychiatric: Normal mood and affect   RESULTS  Summary of this visit's results, reviewed by myself:   EKG Interpretation  Date/Time:    Ventricular Rate:    PR Interval:    QRS Duration:   QT Interval:    QTC Calculation:   R Axis:     Text Interpretation:        Laboratory Studies: Results for orders placed or performed during the hospital encounter of 05/20/18 (from the past 24 hour(s))  Lipase, blood     Status: None   Collection Time: 05/20/18  9:01 PM  Result Value Ref Range   Lipase 29 11 - 51 U/L  Comprehensive metabolic panel     Status: Abnormal   Collection Time: 05/20/18  9:01 PM  Result Value Ref Range   Sodium 138 135 - 145 mmol/L   Potassium 3.5 3.5 - 5.1 mmol/L   Chloride 107 98 - 111 mmol/L   CO2 25 22 - 32  mmol/L   Glucose, Bld 145 (H) 70 - 99 mg/dL   BUN 10 6 - 20 mg/dL   Creatinine, Ser 1.610.85 0.44 - 1.00 mg/dL   Calcium 8.9 8.9 - 09.610.3 mg/dL   Total Protein 8.3 (H) 6.5 - 8.1 g/dL   Albumin 3.7 3.5 - 5.0 g/dL   AST 23 15 - 41 U/L   ALT 22 0 - 44 U/L   Alkaline Phosphatase 96 38 - 126 U/L   Total Bilirubin 0.6 0.3 - 1.2 mg/dL   GFR calc non Af Amer >60 >60 mL/min   GFR calc Af Amer >60 >60 mL/min   Anion gap 6 5 - 15  CBC     Status: Abnormal   Collection Time: 05/20/18  9:01 PM  Result Value Ref Range   WBC 9.9 4.0 - 10.5 K/uL   RBC 4.78 3.87 - 5.11 MIL/uL   Hemoglobin 9.2 (L) 12.0 -  15.0 g/dL   HCT 78.232.5 (L) 95.636.0 - 21.346.0 %   MCV 68.0 (L) 80.0 - 100.0 fL   MCH 19.2 (L) 26.0 - 34.0 pg   MCHC 28.3 (L) 30.0 - 36.0 g/dL   RDW 08.619.9 (H) 57.811.5 - 46.915.5 %   Platelets 405 (H) 150 - 400 K/uL   nRBC 0.0 0.0 - 0.2 %  I-Stat beta hCG blood, ED     Status: None   Collection Time: 05/20/18  9:07 PM  Result Value Ref Range   I-stat hCG, quantitative <5.0 <5 mIU/mL   Comment 3          Urinalysis, Routine w reflex microscopic     Status: Abnormal   Collection Time: 05/21/18 12:19 AM  Result Value Ref Range   Color, Urine YELLOW YELLOW   APPearance HAZY (A) CLEAR   Specific Gravity, Urine 1.024 1.005 - 1.030   pH 6.0 5.0 - 8.0   Glucose, UA NEGATIVE NEGATIVE mg/dL   Hgb urine dipstick NEGATIVE NEGATIVE   Bilirubin Urine NEGATIVE NEGATIVE   Ketones, ur NEGATIVE NEGATIVE mg/dL   Protein, ur NEGATIVE NEGATIVE mg/dL   Nitrite NEGATIVE NEGATIVE   Leukocytes, UA NEGATIVE NEGATIVE   Imaging Studies: No results found.  ED COURSE and MDM  Nursing notes and initial vitals signs, including pulse oximetry, reviewed.  Vitals:   05/20/18 2058 05/20/18 2344  BP: (!) 154/109 (!) 133/96  Pulse: (!) 114 84  Resp: 15 16  Temp: 98.9 F (37.2 C)   TempSrc: Oral   SpO2: 98% 98%  Weight: 131.1 kg   Height: 5\' 4"  (1.626 m)    1:38 AM Patient feeling better after IV fluids.  Nausea improved after IV  Zofran.  Patient drinking fluids without emesis.  Symptoms consistent with a viral gastroenteritis.  PROCEDURES    ED DIAGNOSES     ICD-10-CM   1. Gastroenteritis K52.9        Bearl Talarico, MD 05/21/18 [phone]

## 2018-05-20 NOTE — ED Notes (Addendum)
Pt aware that urine sample is needed. Unable to provide right now  

## 2018-05-21 LAB — URINALYSIS, ROUTINE W REFLEX MICROSCOPIC
Bilirubin Urine: NEGATIVE
Glucose, UA: NEGATIVE mg/dL
Hgb urine dipstick: NEGATIVE
Ketones, ur: NEGATIVE mg/dL
Leukocytes, UA: NEGATIVE
Nitrite: NEGATIVE
Protein, ur: NEGATIVE mg/dL
Specific Gravity, Urine: 1.024 (ref 1.005–1.030)
pH: 6 (ref 5.0–8.0)

## 2018-05-21 MED ORDER — ONDANSETRON 8 MG PO TBDP: 8.0000 mg | ORAL_TABLET | Freq: Three times a day (TID) | ORAL | 0 refills | Status: DC | PRN

## 2018-05-21 NOTE — ED Notes (Signed)
Urine and culture sent to lab  

## 2018-05-21 NOTE — ED Notes (Signed)
Pt given 4 oz of water for PO challenge 

## 2018-05-21 NOTE — ED Notes (Signed)
Pt and visitor verbalized discharge instructions and follow up care. Alert and ambulatory  

## 2018-05-21 NOTE — ED Notes (Signed)
Pt ambulated to the bathroom independently. Gait steady  

## 2020-04-17 ENCOUNTER — Other Ambulatory Visit: Payer: Self-pay

## 2020-04-17 ENCOUNTER — Encounter (HOSPITAL_BASED_OUTPATIENT_CLINIC_OR_DEPARTMENT_OTHER): Payer: Self-pay | Admitting: *Deleted

## 2020-04-17 DIAGNOSIS — Z5321 Procedure and treatment not carried out due to patient leaving prior to being seen by health care provider: Secondary | ICD-10-CM | POA: Insufficient documentation

## 2020-04-17 DIAGNOSIS — J45909 Unspecified asthma, uncomplicated: Secondary | ICD-10-CM | POA: Insufficient documentation

## 2020-04-17 DIAGNOSIS — R0602 Shortness of breath: Secondary | ICD-10-CM | POA: Insufficient documentation

## 2020-04-17 DIAGNOSIS — R059 Cough, unspecified: Secondary | ICD-10-CM | POA: Insufficient documentation

## 2020-04-17 MED ORDER — ACETAMINOPHEN 325 MG PO TABS
650.0000 mg | ORAL_TABLET | Freq: Once | ORAL | Status: AC | PRN
  Administered 2020-04-17: 650 mg via ORAL
  Filled 2020-04-17: qty 2

## 2020-04-17 NOTE — ED Triage Notes (Signed)
Cough x 2 weeks, SOB today. Hx of asthma. States she had a negative home covid test yesterday. RT to triage to assess

## 2020-04-18 ENCOUNTER — Emergency Department (HOSPITAL_BASED_OUTPATIENT_CLINIC_OR_DEPARTMENT_OTHER)
Admission: EM | Admit: 2020-04-18 | Discharge: 2020-04-18 | Disposition: A | Discharge status: Home / Self Care | Payer: 59 | Attending: Emergency Medicine | Admitting: Emergency Medicine

## 2020-04-20 ENCOUNTER — Other Ambulatory Visit: Payer: Self-pay

## 2020-04-20 ENCOUNTER — Encounter: Payer: Self-pay | Admitting: Emergency Medicine

## 2020-04-20 ENCOUNTER — Ambulatory Visit
Admission: EM | Admit: 2020-04-20 | Discharge: 2020-04-20 | Disposition: A | Discharge status: Home / Self Care | Payer: Self-pay | Attending: Internal Medicine | Admitting: Internal Medicine

## 2020-04-20 DIAGNOSIS — J45901 Unspecified asthma with (acute) exacerbation: Secondary | ICD-10-CM

## 2020-04-20 MED ORDER — ALBUTEROL SULFATE HFA 108 (90 BASE) MCG/ACT IN AERS
2.0000 | INHALATION_SPRAY | Freq: Once | RESPIRATORY_TRACT | Status: AC
  Administered 2020-04-20: 2 via RESPIRATORY_TRACT

## 2020-04-20 MED ORDER — PREDNISONE 10 MG PO TABS: 40.0000 mg | ORAL_TABLET | Freq: Every day | ORAL | 0 refills | Status: AC

## 2020-04-20 MED ORDER — METHYLPREDNISOLONE SODIUM SUCC 125 MG IJ SOLR
60.0000 mg | Freq: Once | INTRAMUSCULAR | Status: AC
  Administered 2020-04-20: 60 mg via INTRAMUSCULAR

## 2020-04-20 NOTE — Discharge Instructions (Addendum)
We have prescribed you albuterol with a spacer to help with the administration.  We gave you a steroid injection today and prescribed oral steroids for you to take for four days starting tomorrow.

## 2020-04-20 NOTE — ED Provider Notes (Signed)
[facility]    CSN: [id number] Arrival date & time: 04/20/20  1435      History   Chief Complaint Chief Complaint  Patient presents with  . Asthma    HPI Samantha Allen is a 25 y.o. female.   Patient has a history of asthma for which she takes albuterol as needed. Occasionally vapes but Hasn't in the past two weeks.  No tobacco use currently.    She was sick for about 2 weeks and is now just starting to feel better for the past 3 days. Her fever/chills, and other symptoms resolved.  She has been having difficulty breathing still. She feels like her chest has been 'collapsing'.  She woke last night with difficulty breathing. She used the last of her albuterol inhaler last night.   She went through an entire inhaler in about a week.  No other significant medical history.  Does not take any other medications.        Past Medical History:  Diagnosis Date  . Asthma   . Pain from implanted hardware 11/2011   retained syndesmosis screw right ankle    Patient Active Problem List   Diagnosis Date Noted  . Anemia 04/07/2015  . Prediabetes 04/07/2015  . Obesity, morbid, BMI 50 or higher (HCC) 04/01/2015  . Abnormal uterine bleeding (AUB) 04/01/2015    Past Surgical History:  Procedure Laterality Date  . CHOLECYSTECTOMY    . HARDWARE REMOVAL  11/22/2011   Procedure: HARDWARE REMOVAL;  Surgeon: Toni Arthurs, MD;  Location: [facility];  Service: Orthopedics;  Laterality: Right;  . ORIF ANKLE FRACTURE  07/2011   right  . TONSILLECTOMY      OB History   No obstetric history on file.      Home Medications    Prior to Admission medications   Medication Sig Start Date End Date Taking? Authorizing Provider  predniSONE (DELTASONE) 10 MG tablet Take 4 tablets (40 mg total) by mouth daily with breakfast for 4 days. 04/21/20 04/25/20 Yes Sandre Kitty, MD  albuterol (PROVENTIL HFA;VENTOLIN HFA) 108 (90 BASE) MCG/ACT inhaler Inhale 1-2 puffs into the lungs  every 6 (six) hours as needed for wheezing or shortness of breath. 08/19/14   Marlon Pel, PA-C  ibuprofen (ADVIL,MOTRIN) 200 MG tablet Take 200 mg by mouth every 6 (six) hours as needed. Pain/ headache    [provider]  ondansetron (ZOFRAN ODT) 8 MG disintegrating tablet Take 1 tablet (8 mg total) by mouth every 8 (eight) hours as needed for nausea or vomiting. 05/21/18   Molpus, John, MD    Family History Family History  Problem Relation Age of Onset  . Anesthesia problems Mother        post-op N/V  . Hypertension Mother   . Diabetes Father     Social History Social History   Tobacco Use  . Smoking status: Former Games developer  . Smokeless tobacco: Never Used  Vaping Use  . Vaping Use: Every day  Substance Use Topics  . Alcohol use: Yes  . Drug use: No     Allergies    Bing allergy]   Review of Systems Review of Systems  All other systems reviewed and are negative.    Physical Exam Triage Vital Signs ED Triage Vitals  Enc Vitals Group     BP 04/20/20 1709 (!) 145/94     Pulse Rate 04/20/20 1709 66     Resp 04/20/20 1709 (!) 22     Temp  04/20/20 1709 98.4 F (36.9 C)     Temp Source 04/20/20 1709 Oral     SpO2 04/20/20 1709 96 %     Weight --      Height --      Head Circumference --      Peak Flow --      Pain Score 04/20/20 1722 2     Pain Loc --      Pain Edu? --      Excl. in GC? --    No data found.  Updated Vital Signs BP (!) 145/94   Pulse 66   Temp 98.4 F (36.9 C) (Oral)   Resp (!) 22   SpO2 96%   Visual Acuity Right Eye Distance:   Left Eye Distance:   Bilateral Distance:    Right Eye Near:   Left Eye Near:    Bilateral Near:     Physical Exam Vitals reviewed.  Cardiovascular:     Rate and Rhythm: Normal rate and regular rhythm.     Pulses: Normal pulses.  Pulmonary:     Breath sounds: Wheezing present.     Comments: Pt coughing throughout.  Diffuse expiratory wheeze bilaterally.  Decreased air movement.    Neurological:     Mental Status: She is alert.      UC Treatments / Results  Labs (all labs ordered are listed, but only abnormal results are displayed) Labs Reviewed - No data to display  EKG   Radiology No results found.  Procedures Procedures (including critical care time)  Medications Ordered in UC Medications  albuterol (VENTOLIN HFA) 108 (90 Base) MCG/ACT inhaler 2 puff (2 puffs Inhalation Given 04/20/20 1740)  methylPREDNISolone sodium succinate (SOLU-MEDROL) 125 mg/2 mL injection 60 mg (60 mg Intramuscular Given 04/20/20 1740)    Initial Impression / Assessment and Plan / UC Course  I have reviewed the triage vital signs and the nursing notes.  Pertinent labs & imaging results that were available during my care of the patient were reviewed by me and considered in my medical decision making (see chart for details).     Pt with acute worsening of asthma after two weeks of viral respiratory illness.  Able to speak full sentences but had restricted air movement and wheezing on exam.  Gave pt solumedrol 60mg  in the clinic and 4 days of 40mg  prednisone.  Gave pt albuterol inhaler and spacer.    Final Clinical Impressions(s) / UC Diagnoses   Final diagnoses:  Moderate asthma with acute exacerbation, unspecified whether persistent     Discharge Instructions     We have prescribed you albuterol with a spacer to help with the administration.  We gave you a steroid injection today and prescribed oral steroids for you to take for four days starting tomorrow.      ED Prescriptions    Medication Sig Dispense Auth. Provider   predniSONE (DELTASONE) 10 MG tablet Take 4 tablets (40 mg total) by mouth daily with breakfast for 4 days. 16 tablet , MD     PDMP not reviewed this encounter.   , MD 04/20/20 [phone]

## 2020-04-20 NOTE — ED Triage Notes (Signed)
Pt sts cough and URI x 2 weeks; pt sts increased asthma sx and sts out of her meds

## 2020-05-24 ENCOUNTER — Telehealth: Payer: Self-pay | Admitting: General Practice

## 2020-05-24 NOTE — Telephone Encounter (Signed)
Copied from CRM [id number]. Topic: General - Other >> May 23, 2020  4:13 PM Elliot Gault wrote: Reason for CRM: patient would like to schedule financial counseling appointment with Mikle Bosworth. Please return call to patient not caller. Best # [phone]   Call placed to patient and was unable to leave voicemail. In order to apply for CAFA patient must complete a new patient appointment with [facility] and establish care here.

## 2020-08-12 ENCOUNTER — Other Ambulatory Visit: Payer: Self-pay

## 2020-08-12 ENCOUNTER — Emergency Department (HOSPITAL_COMMUNITY): Payer: 59

## 2020-08-12 ENCOUNTER — Emergency Department (HOSPITAL_COMMUNITY)
Admission: EM | Admit: 2020-08-12 | Discharge: 2020-08-12 | Disposition: A | Discharge status: Home / Self Care | Payer: 59 | Attending: Emergency Medicine | Admitting: Emergency Medicine

## 2020-08-12 ENCOUNTER — Encounter (HOSPITAL_COMMUNITY): Payer: Self-pay

## 2020-08-12 DIAGNOSIS — Z87891 Personal history of nicotine dependence: Secondary | ICD-10-CM | POA: Insufficient documentation

## 2020-08-12 DIAGNOSIS — R519 Headache, unspecified: Secondary | ICD-10-CM | POA: Insufficient documentation

## 2020-08-12 DIAGNOSIS — J45909 Unspecified asthma, uncomplicated: Secondary | ICD-10-CM | POA: Diagnosis not present

## 2020-08-12 DIAGNOSIS — R112 Nausea with vomiting, unspecified: Secondary | ICD-10-CM | POA: Diagnosis not present

## 2020-08-12 DIAGNOSIS — R42 Dizziness and giddiness: Secondary | ICD-10-CM | POA: Insufficient documentation

## 2020-08-12 LAB — COMPREHENSIVE METABOLIC PANEL
ALT: 21 U/L (ref 0–44)
AST: 21 U/L (ref 15–41)
Albumin: 3.7 g/dL (ref 3.5–5.0)
Alkaline Phosphatase: 97 U/L (ref 38–126)
Anion gap: 13 (ref 5–15)
BUN: 12 mg/dL (ref 6–20)
CO2: 21 mmol/L — ABNORMAL LOW (ref 22–32)
Calcium: 8.8 mg/dL — ABNORMAL LOW (ref 8.9–10.3)
Chloride: 106 mmol/L (ref 98–111)
Creatinine, Ser: 0.8 mg/dL (ref 0.44–1.00)
GFR, Estimated: >60 mL/min (ref >60)
Glucose, Bld: 211 mg/dL — ABNORMAL HIGH (ref 70–99)
Potassium: 3 mmol/L — ABNORMAL LOW (ref 3.5–5.1)
Sodium: 140 mmol/L (ref 135–145)
Total Bilirubin: 0.3 mg/dL (ref 0.3–1.2)
Total Protein: 8.1 g/dL (ref 6.5–8.1)

## 2020-08-12 LAB — CBC WITH DIFFERENTIAL/PLATELET
Abs Immature Granulocytes: 0.15 10*3/uL — ABNORMAL HIGH (ref 0.00–0.07)
Basophils Absolute: 0.1 10*3/uL (ref 0.0–0.1)
Basophils Relative: 1 %
Eosinophils Absolute: 0.2 10*3/uL (ref 0.0–0.5)
Eosinophils Relative: 2 %
HCT: 34.2 % — ABNORMAL LOW (ref 36.0–46.0)
Hemoglobin: 9.8 g/dL — ABNORMAL LOW (ref 12.0–15.0)
Immature Granulocytes: 1 %
Lymphocytes Relative: 40 %
Lymphs Abs: 5.3 10*3/uL — ABNORMAL HIGH (ref 0.7–4.0)
MCH: 20.3 pg — ABNORMAL LOW (ref 26.0–34.0)
MCHC: 28.7 g/dL — ABNORMAL LOW (ref 30.0–36.0)
MCV: 71 fL — ABNORMAL LOW (ref 80.0–100.0)
Monocytes Absolute: 1.1 10*3/uL — ABNORMAL HIGH (ref 0.1–1.0)
Monocytes Relative: 8 %
Neutro Abs: 6.6 10*3/uL (ref 1.7–7.7)
Neutrophils Relative %: 48 %
Platelets: 471 10*3/uL — ABNORMAL HIGH (ref 150–400)
RBC: 4.82 MIL/uL (ref 3.87–5.11)
RDW: 19.5 % — ABNORMAL HIGH (ref 11.5–15.5)
WBC: 13.4 10*3/uL — ABNORMAL HIGH (ref 4.0–10.5)
nRBC: 0 % (ref 0.0–0.2)

## 2020-08-12 LAB — TROPONIN I (HIGH SENSITIVITY)
Troponin I (High Sensitivity): 3 ng/L (ref <18)
Troponin I (High Sensitivity): 4 ng/L (ref <18)

## 2020-08-12 LAB — I-STAT BETA HCG BLOOD, ED (MC, WL, AP ONLY): I-stat hCG, quantitative: <5 m[IU]/mL (ref <5)

## 2020-08-12 LAB — URINALYSIS, ROUTINE W REFLEX MICROSCOPIC
Bilirubin Urine: NEGATIVE
Glucose, UA: NEGATIVE mg/dL
Hgb urine dipstick: NEGATIVE
Ketones, ur: NEGATIVE mg/dL
Leukocytes,Ua: NEGATIVE
Nitrite: NEGATIVE
Protein, ur: NEGATIVE mg/dL
Specific Gravity, Urine: 1.009 (ref 1.005–1.030)
pH: 7 (ref 5.0–8.0)

## 2020-08-12 LAB — LIPASE, BLOOD: Lipase: 23 U/L (ref 11–51)

## 2020-08-12 IMAGING — MR MR [PERSON_NAME] HEAD
15 of 17 series · 40 of 48 positions shown · IV contrast (gadavist)
Comparison: CT head [DATE].

CLINICAL DATA: SEVERE VERTIGO AND DIZZINESS.

EXAM:
MRI HEAD WITHOUT AND WITH CONTRAST
MR VENOGRAM OF THE HEAD WITHOUT CONTRAST
TECHNIQUE: Multiplanar, multiecho pulse sequences of the brain and surrounding
structures were obtained without and with intravenous contrast.
Angiographic images of the intracranial venous structures were
obtained using MRV technique without intravenous contrast.
CONTRAST:  10mL GADAVIST GADOBUTROL 1 MMOL/ML IV SOLN

[Series 5: DWI · axial · 3.0mm · 1.36mm/px · z∈[-88,+76]mm · 5 of 112 slices shown (1 of 2)]
[im 1/112]
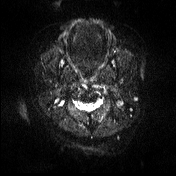
[im 28/112]
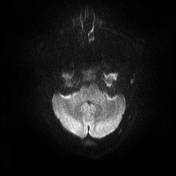
[im 56/112]
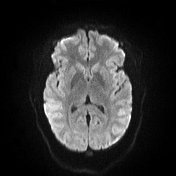
[im 84/112]
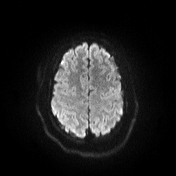
[im 112/112]
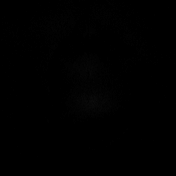

[Series 6: DWI · axial · 3.0mm · 1.36mm/px · z∈[-88,+67]mm · 2 of 53 slices shown (2 of 2)]
[im 1/53]
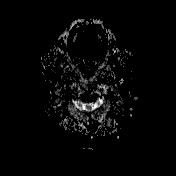
[im 53/53]
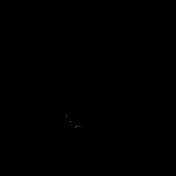

[Series 7: T1 · sagittal · 5.0mm · 0.75mm/px · 1 of 24 slices shown (1 of 3)]
[im 1/24]
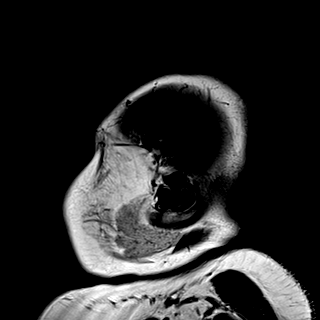

[Series 8: T2 · axial · 5.0mm · 0.57mm/px · 1 of 25 slices shown]
[im 1/25]
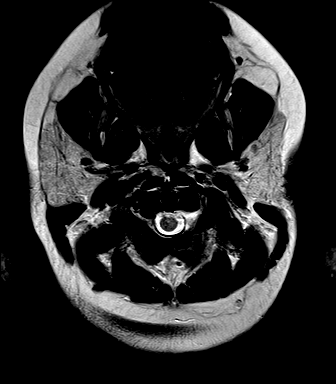

[Series 9: FLAIR · axial · 3.0mm · 0.75mm/px · z∈[-84,+69]mm · 2 of 52 slices shown]
[im 1/52]
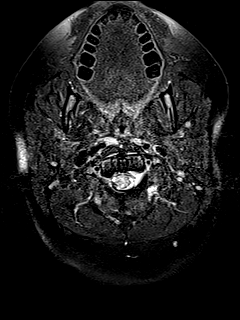
[im 52/52]
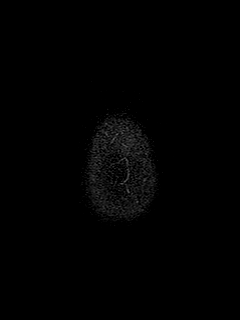

[Series 16: cor 2d · coronal · 2.5mm · 0.69mm/px · 5 of 114 slices shown]
[im 1/114]
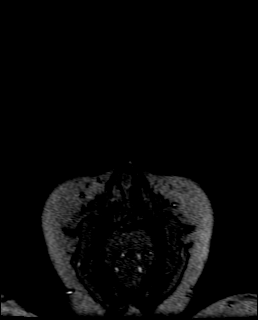
[im 29/114]
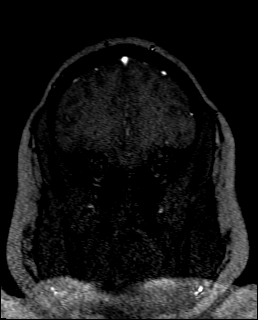
[im 57/114]
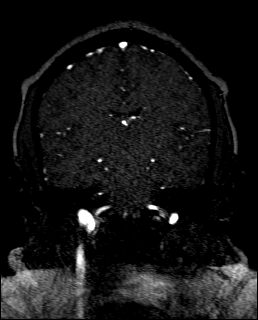
[im 85/114]
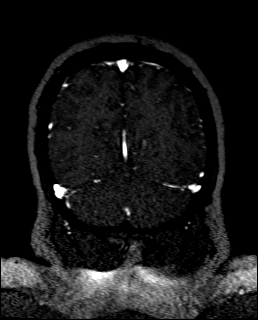
[im 114/114]
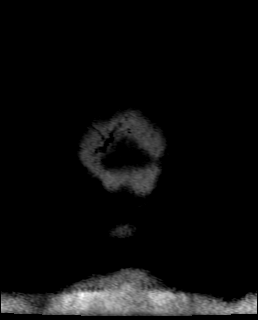

[Series 24: mip_images(sw) · axial · 24.0mm · 0.75mm/px · z∈[-75,+57]mm · 2 of 45 slices shown]
[im 1/45]
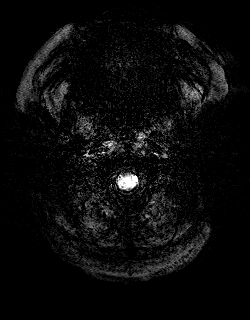
[im 45/45]
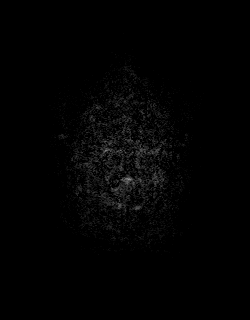

[Series 25: swi_images · axial · 3.0mm · 0.75mm/px · z∈[-85,+68]mm · 2 of 52 slices shown]
[im 1/52]
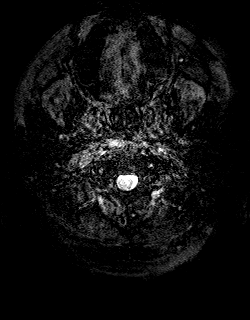
[im 52/52]
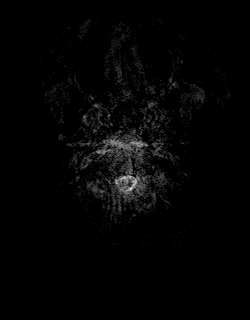

[Series 26: bSSFP · axial · 0.6mm · 0.56mm/px · z∈[-74,-41]mm · 3 of 56 slices shown]
[im 1/56]
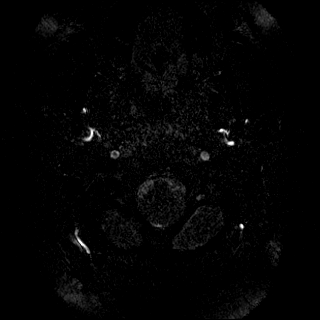
[im 28/56]
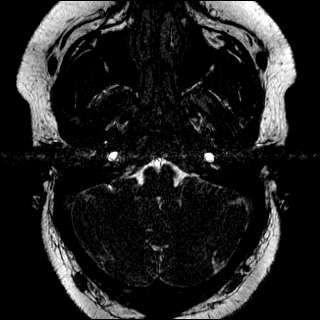
[im 56/56]
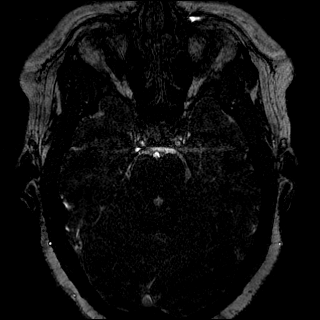

[Series 27: T1 · axial · 3.0mm · 0.31mm/px · 1 of 15 slices shown (2 of 3)]
[im 1/15]
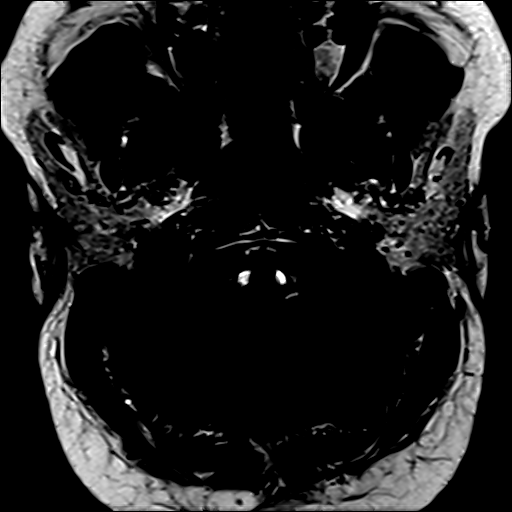

[Series 28: T1 · coronal · 3.0mm · 0.50mm/px · 1 of 12 slices shown (3 of 3)]
[im 1/12]
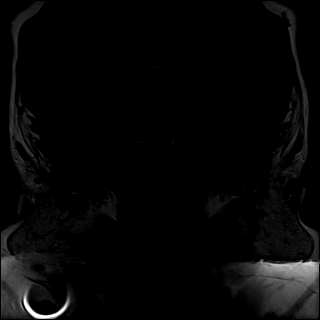

[Series 29: T1 post-contrast · sagittal · 1.0mm · 0.94mm/px · 6 of 128 slices shown (1 of 4)]
[im 1/128]
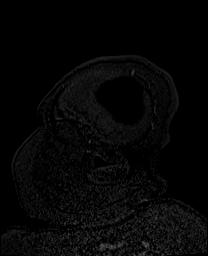
[im 26/128]
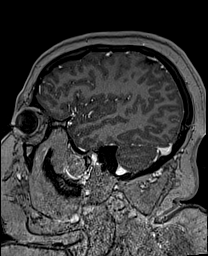
[im 51/128]
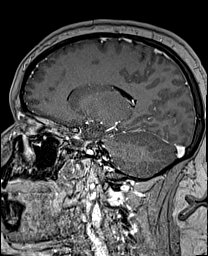
[im 77/128]
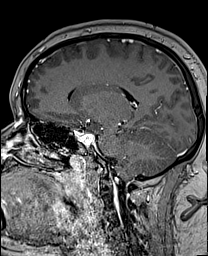
[im 102/128]
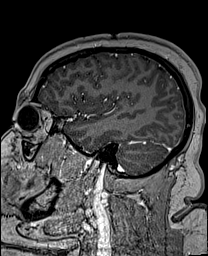
[im 128/128]
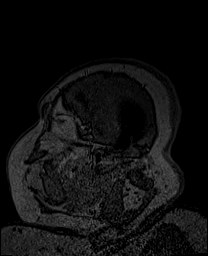

[Series 30: T1 post-contrast · coronal · 3.0mm · 0.50mm/px · 1 of 12 slices shown (2 of 4)]
[im 1/12]
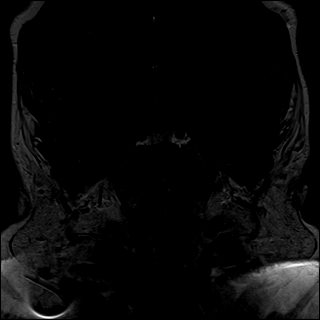

[Series 31: T1 post-contrast · axial · 3.0mm · 0.31mm/px · 1 of 15 slices shown (3 of 4)]
[im 1/15]
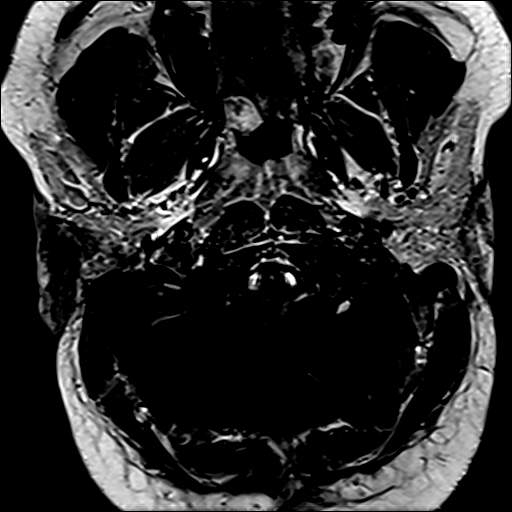

[Series 32: T1 post-contrast · axial · 1.0mm · 0.94mm/px · z∈[-88,+70]mm · 7 of 160 slices shown (4 of 4)]
[im 1/160]
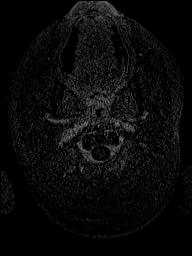
[im 27/160]
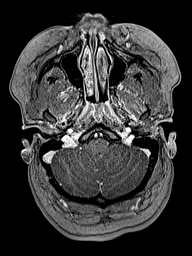
[im 54/160]
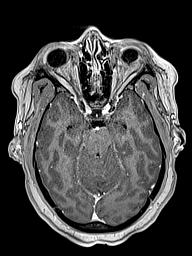
[im 80/160]
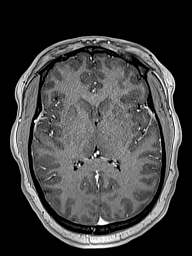
[im 107/160]
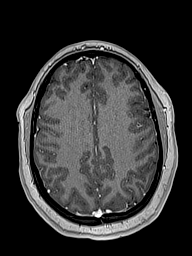
[im 133/160]
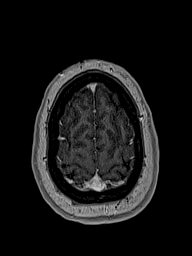
[im 160/160]
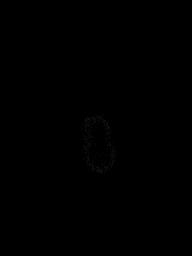

[40 of 48 positions shown; findings below may reference images not displayed]

FINDINGS: MRI HEAD:
Brain: No acute infarction, hemorrhage, hydrocephalus, extra-axial
collection or mass lesion. Approximately 2-3 mm of inferior
cerebellar tonsillar ectopia. The posterior fossa is small, the
foramen magnum is crowded, and the tonsils are somewhat pointed in
morphology. There are a few nonspecific T2/FLAIR hyperintensities
within the white matter, not particularly advanced for age.

IAC protocol was performed. Motion limits the high-resolution T2;
however, visualized seventh and eighth cranial nerves are grossly
unremarkable. No evidence of a retrocochlear mass or abnormal
enhancement. No mastoid effusions. Unremarkable appearance of the
adjacent dural venous sinuses and jugular bulbs normal CSF signal
within the labyrinth bilaterally.

Vascular: Major arterial flow voids are maintained at the skull
base. No evidence of dural sinus thrombosis.

Skull and upper cervical spine: Diffuse T1 hypointensity of the
marrow, most likely related to the patient's reported history of
anemia.

Sinuses/Orbits: Clear sinuses.  Unremarkable orbits.

Other: Abnormal thickening and enhancement of the posterior
pharyngeal mucosa and the partially visualized prevertebral soft
tissues in the upper cervical spine. Adenoid hypertrophy.

MR VENOGRAM:

No evidence of dural sinus thrombosis.
IMPRESSION: 1. Approximately 2-3 mm of inferior cerebellar tonsillar ectopia.
While this does not meet standard criteria for a Chiari
malformation, the posterior fossa is small, the foramen magnum is
crowded, and the tonsils are somewhat pointed in morphology.
Therefore, this could potentially be a cause of the patient's
reported symptoms.
2. Abnormal thickening and enhancement of the posterior pharyngeal
mucosa and the partially visualized prevertebral soft tissues in the
upper cervical spine which is of unclear significance. This could
potentially represent a severe pharyngitis/infection. Recommend
correlation with direct inspection. CT of the neck could further
evaluate if clinically indicated.
3. No evidence of dural sinus thrombosis or a retrocochlear mass.
4. Diffuse T1 hypointensity of the marrow, most likely related to
the patient's reported history of anemia.

## 2020-08-12 IMAGING — MR MR BRAIN/IAC WO/W CM
15 of 17 series · 40 of 48 positions shown · IV contrast (gadavist)
Comparison: CT head [DATE].

CLINICAL DATA: SEVERE VERTIGO AND DIZZINESS.

EXAM:
MRI HEAD WITHOUT AND WITH CONTRAST
MR VENOGRAM OF THE HEAD WITHOUT CONTRAST
TECHNIQUE: Multiplanar, multiecho pulse sequences of the brain and surrounding
structures were obtained without and with intravenous contrast.
Angiographic images of the intracranial venous structures were
obtained using MRV technique without intravenous contrast.
CONTRAST:  10mL GADAVIST GADOBUTROL 1 MMOL/ML IV SOLN

[Series 5: DWI · axial · 3.0mm · 1.36mm/px · z∈[-88,+76]mm · 5 of 112 slices shown (1 of 2)]
[im 1/112]
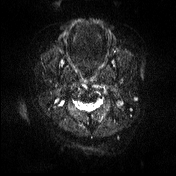
[im 28/112]
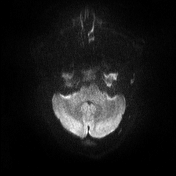
[im 56/112]
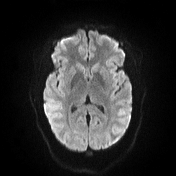
[im 84/112]
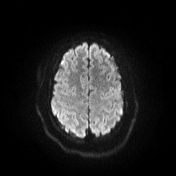
[im 112/112]
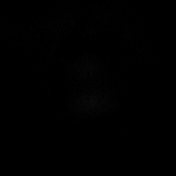

[Series 6: DWI · axial · 3.0mm · 1.36mm/px · z∈[-88,+67]mm · 2 of 53 slices shown (2 of 2)]
[im 1/53]
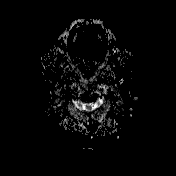
[im 53/53]
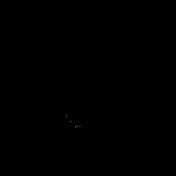

[Series 7: T1 · sagittal · 5.0mm · 0.75mm/px · 1 of 24 slices shown (1 of 3)]
[im 1/24]
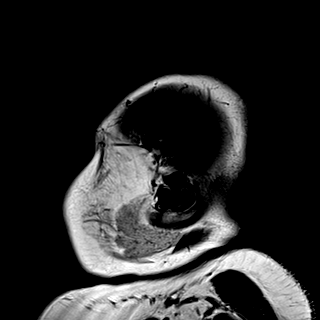

[Series 8: T2 · axial · 5.0mm · 0.57mm/px · 1 of 25 slices shown]
[im 1/25]
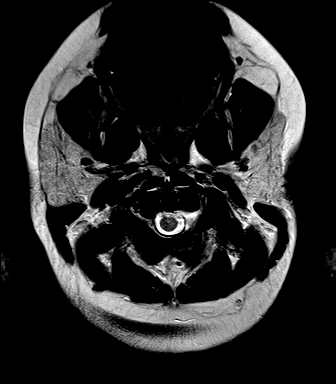

[Series 9: FLAIR · axial · 3.0mm · 0.75mm/px · z∈[-84,+69]mm · 2 of 52 slices shown]
[im 1/52]
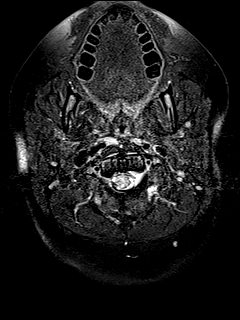
[im 52/52]
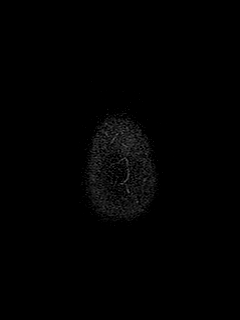

[Series 16: cor 2d · coronal · 2.5mm · 0.69mm/px · 5 of 114 slices shown]
[im 1/114]
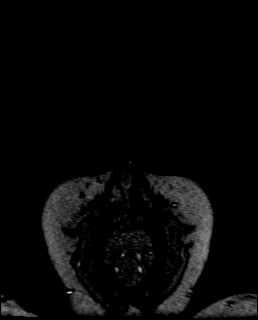
[im 29/114]
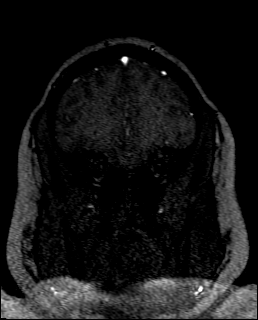
[im 57/114]
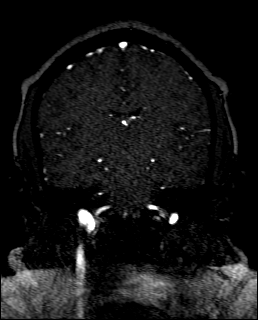
[im 85/114]
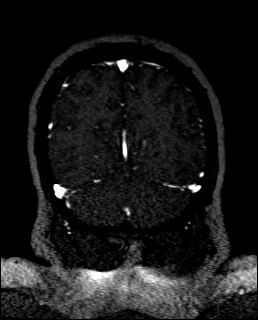
[im 114/114]
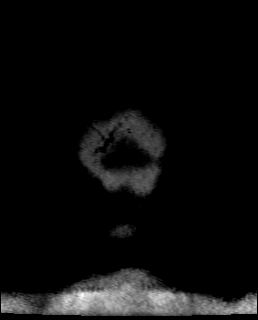

[Series 24: mip_images(sw) · axial · 24.0mm · 0.75mm/px · z∈[-75,+57]mm · 2 of 45 slices shown]
[im 1/45]
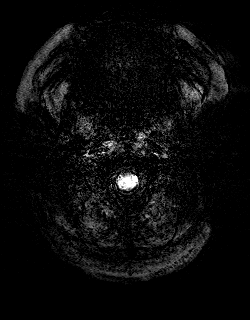
[im 45/45]
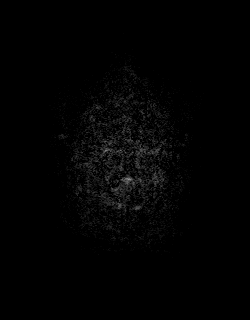

[Series 25: swi_images · axial · 3.0mm · 0.75mm/px · z∈[-85,+68]mm · 2 of 52 slices shown]
[im 1/52]
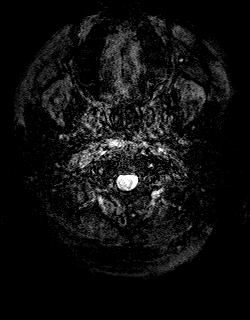
[im 52/52]
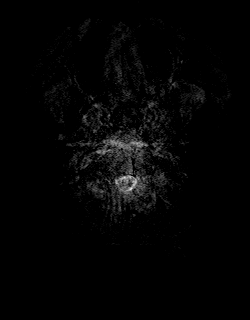

[Series 26: bSSFP · axial · 0.6mm · 0.56mm/px · z∈[-74,-41]mm · 3 of 56 slices shown]
[im 1/56]
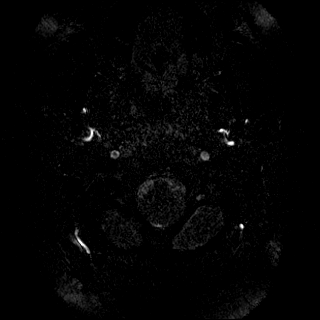
[im 28/56]
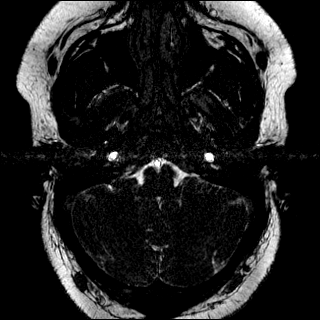
[im 56/56]
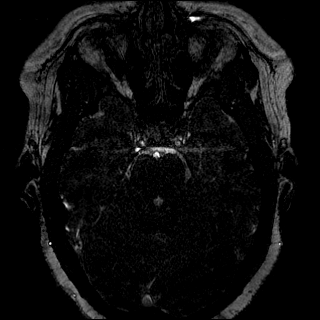

[Series 27: T1 · axial · 3.0mm · 0.31mm/px · 1 of 15 slices shown (2 of 3)]
[im 1/15]
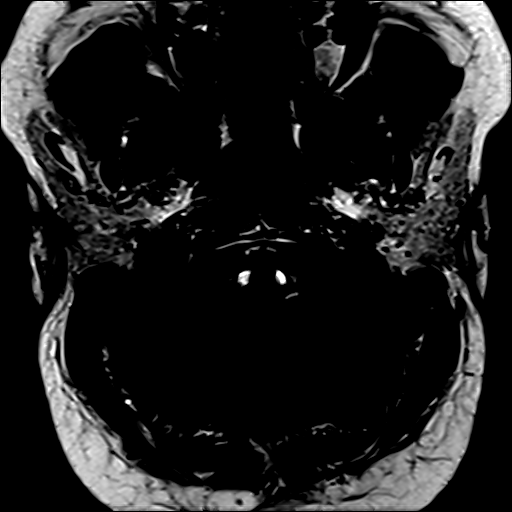

[Series 28: T1 · coronal · 3.0mm · 0.50mm/px · 1 of 12 slices shown (3 of 3)]
[im 1/12]
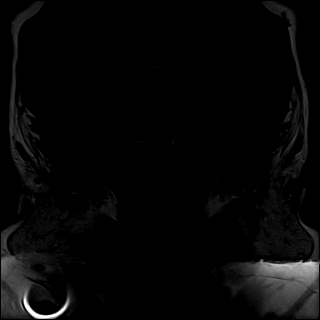

[Series 29: T1 post-contrast · sagittal · 1.0mm · 0.94mm/px · 6 of 128 slices shown (1 of 4)]
[im 1/128]
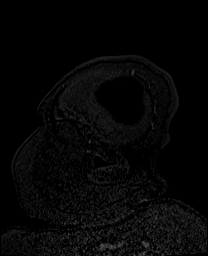
[im 26/128]
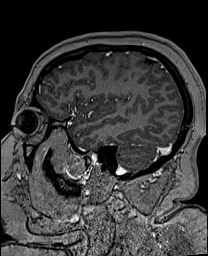
[im 51/128]
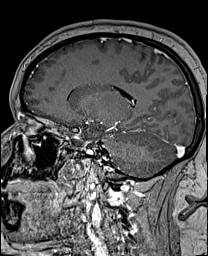
[im 77/128]
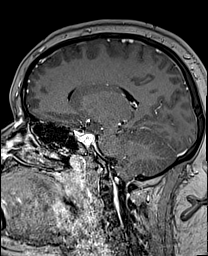
[im 102/128]
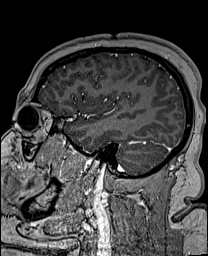
[im 128/128]
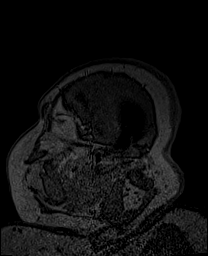

[Series 30: T1 post-contrast · coronal · 3.0mm · 0.50mm/px · 1 of 12 slices shown (2 of 4)]
[im 1/12]
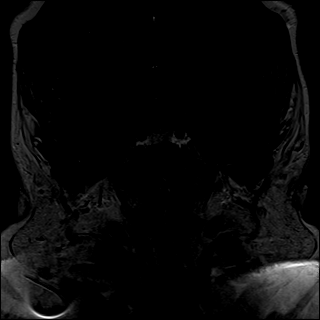

[Series 31: T1 post-contrast · axial · 3.0mm · 0.31mm/px · 1 of 15 slices shown (3 of 4)]
[im 1/15]
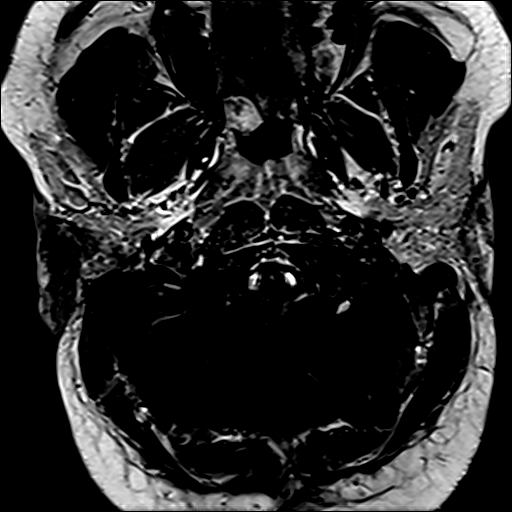

[Series 32: T1 post-contrast · axial · 1.0mm · 0.94mm/px · z∈[-88,+70]mm · 7 of 160 slices shown (4 of 4)]
[im 1/160]
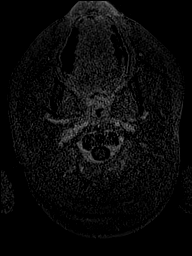
[im 27/160]
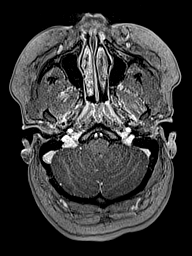
[im 54/160]
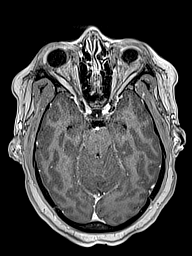
[im 80/160]
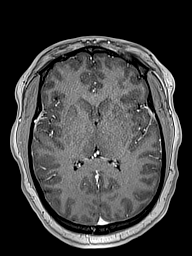
[im 107/160]
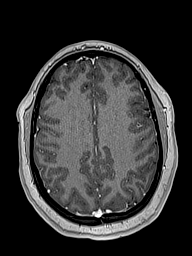
[im 133/160]
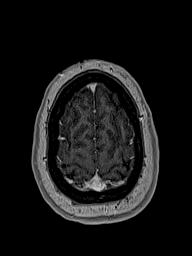
[im 160/160]
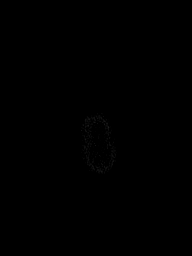

[40 of 48 positions shown; findings below may reference images not displayed]

FINDINGS: MRI HEAD:
Brain: No acute infarction, hemorrhage, hydrocephalus, extra-axial
collection or mass lesion. Approximately 2-3 mm of inferior
cerebellar tonsillar ectopia. The posterior fossa is small, the
foramen magnum is crowded, and the tonsils are somewhat pointed in
morphology. There are a few nonspecific T2/FLAIR hyperintensities
within the white matter, not particularly advanced for age.

IAC protocol was performed. Motion limits the high-resolution T2;
however, visualized seventh and eighth cranial nerves are grossly
unremarkable. No evidence of a retrocochlear mass or abnormal
enhancement. No mastoid effusions. Unremarkable appearance of the
adjacent dural venous sinuses and jugular bulbs normal CSF signal
within the labyrinth bilaterally.

Vascular: Major arterial flow voids are maintained at the skull
base. No evidence of dural sinus thrombosis.

Skull and upper cervical spine: Diffuse T1 hypointensity of the
marrow, most likely related to the patient's reported history of
anemia.

Sinuses/Orbits: Clear sinuses.  Unremarkable orbits.

Other: Abnormal thickening and enhancement of the posterior
pharyngeal mucosa and the partially visualized prevertebral soft
tissues in the upper cervical spine. Adenoid hypertrophy.

MR VENOGRAM:

No evidence of dural sinus thrombosis.
IMPRESSION: 1. Approximately 2-3 mm of inferior cerebellar tonsillar ectopia.
While this does not meet standard criteria for a Chiari
malformation, the posterior fossa is small, the foramen magnum is
crowded, and the tonsils are somewhat pointed in morphology.
Therefore, this could potentially be a cause of the patient's
reported symptoms.
2. Abnormal thickening and enhancement of the posterior pharyngeal
mucosa and the partially visualized prevertebral soft tissues in the
upper cervical spine which is of unclear significance. This could
potentially represent a severe pharyngitis/infection. Recommend
correlation with direct inspection. CT of the neck could further
evaluate if clinically indicated.
3. No evidence of dural sinus thrombosis or a retrocochlear mass.
4. Diffuse T1 hypointensity of the marrow, most likely related to
the patient's reported history of anemia.

## 2020-08-12 MED ORDER — DEXAMETHASONE SODIUM PHOSPHATE 10 MG/ML IJ SOLN
8.0000 mg | Freq: Once | INTRAMUSCULAR | Status: AC
  Administered 2020-08-12: 8 mg via INTRAVENOUS
  Filled 2020-08-12: qty 1

## 2020-08-12 MED ORDER — DIPHENHYDRAMINE HCL 50 MG/ML IJ SOLN
25.0000 mg | Freq: Once | INTRAMUSCULAR | Status: AC
  Administered 2020-08-12: 25 mg via INTRAVENOUS
  Filled 2020-08-12: qty 1

## 2020-08-12 MED ORDER — GADOBUTROL 1 MMOL/ML IV SOLN
10.0000 mL | Freq: Once | INTRAVENOUS | Status: AC | PRN
  Administered 2020-08-12: 10 mL via INTRAVENOUS

## 2020-08-12 MED ORDER — PROCHLORPERAZINE EDISYLATE 10 MG/2ML IJ SOLN
10.0000 mg | Freq: Once | INTRAMUSCULAR | Status: AC
  Administered 2020-08-12: 10 mg via INTRAVENOUS
  Filled 2020-08-12: qty 2

## 2020-08-12 MED ORDER — MECLIZINE HCL 25 MG PO TABS: 25.0000 mg | ORAL_TABLET | Freq: Three times a day (TID) | ORAL | 0 refills | Status: AC | PRN

## 2020-08-12 NOTE — ED Notes (Signed)
Pt aware that urine sample is needed, states she doesn't have to go right now.

## 2020-08-12 NOTE — Discharge Instructions (Signed)
The radiologist said you may have a Chiari malformation.  Recommend following up with a neurosurgeon.  Additionally recommend following up with a primary care doctor.  Take the prescribed meclizine as needed.  If your dizziness symptoms uncontrolled or more severe, you develop vomiting, sore throat, difficulty swallowing or other new concerning symptom, return to ER for reassessment.

## 2020-08-12 NOTE — ED Provider Notes (Signed)
[facility]-EMERGENCY DEPT Provider Note   CSN: [id number] Arrival date & time: 08/12/20  9485     History Chief Complaint  Patient presents with  . Emesis    Samantha Allen is a 25 y.o. female.  Presents to ER with concern for dizziness and vomiting.  Patient states over the past 2 days she has had a mild frontal headache.  Woke up this morning and felt extremely nauseated, dizzy and had multiple episodes of vomiting.  Nonbloody nonbilious.  Has no headache today.  No abdominal pain.  Has not had anything for her symptoms yet this morning.  She denies any sore throat, no difficulty swallowing.  No fevers.  No dysuria or hematuria.  No speech or vision change, no numbness or weakness.  HPI     Past Medical History:  Diagnosis Date  . Asthma   . Pain from implanted hardware 11/2011   retained syndesmosis screw right ankle    Patient Active Problem List   Diagnosis Date Noted  . Anemia 04/07/2015  . Prediabetes 04/07/2015  . Obesity, morbid, BMI 50 or higher (HCC) 04/01/2015  . Abnormal uterine bleeding (AUB) 04/01/2015    Past Surgical History:  Procedure Laterality Date  . CHOLECYSTECTOMY    . HARDWARE REMOVAL  11/22/2011   Procedure: HARDWARE REMOVAL;  Surgeon: Toni Arthurs, MD;  Location: [facility];  Service: Orthopedics;  Laterality: Right;  . ORIF ANKLE FRACTURE  07/2011   right  . TONSILLECTOMY       OB History   No obstetric history on file.     Family History  Problem Relation Age of Onset  . Anesthesia problems Mother        post-op N/V  . Hypertension Mother   . Diabetes Father     Social History   Tobacco Use  . Smoking status: Former Games developer  . Smokeless tobacco: Never Used  Vaping Use  . Vaping Use: Every day  Substance Use Topics  . Alcohol use: Yes  . Drug use: No    Home Medications Prior to Admission medications   Medication Sig Start Date End Date Taking? Authorizing Provider  acetaminophen (TYLENOL)  325 MG tablet Take 650 mg by mouth every 6 (six) hours as needed for mild pain, fever or headache.   Yes [provider]  albuterol (PROVENTIL HFA;VENTOLIN HFA) 108 (90 BASE) MCG/ACT inhaler Inhale 1-2 puffs into the lungs every 6 (six) hours as needed for wheezing or shortness of breath. 08/19/14  Yes Neva Seat, Tiffany, PA-C  meclizine (ANTIVERT) 25 MG tablet Take 1 tablet (25 mg total) by mouth 3 (three) times daily as needed for dizziness. 08/12/20  Yes Milagros Loll, MD    Allergies    [place] allergy]  Review of Systems   Review of Systems  Constitutional: Negative for chills and fever.  HENT: Negative for ear pain and sore throat.   Eyes: Negative for pain and visual disturbance.  Respiratory: Negative for cough and shortness of breath.   Cardiovascular: Negative for chest pain and palpitations.  Gastrointestinal: Positive for nausea and vomiting. Negative for abdominal pain.  Genitourinary: Negative for dysuria and hematuria.  Musculoskeletal: Negative for arthralgias and back pain.  Skin: Negative for color change and rash.  Neurological: Positive for dizziness. Negative for seizures and syncope.  All other systems reviewed and are negative.   Physical Exam Updated Vital Signs BP (!) 164/92   Pulse 76   Temp 98.1 F (36.7 C) (Oral)  Resp 18   SpO2 99%   Physical Exam Vitals and nursing note reviewed.  Constitutional:      General: She is not in acute distress.    Appearance: She is well-developed.  HENT:     Head: Normocephalic and atraumatic.     Right Ear: Tympanic membrane and ear canal normal.     Left Ear: Tympanic membrane and ear canal normal.     Mouth/Throat:     Mouth: Mucous membranes are moist.     Pharynx: No oropharyngeal exudate or posterior oropharyngeal erythema.     Comments: Clear posterior oropharynx, no exudate or erythema noted Eyes:     Conjunctiva/sclera: Conjunctivae normal.  Cardiovascular:     Rate and Rhythm: Normal rate  and regular rhythm.     Heart sounds: No murmur heard.   Pulmonary:     Effort: Pulmonary effort is normal. No respiratory distress.     Breath sounds: Normal breath sounds.  Abdominal:     Palpations: Abdomen is soft.     Tenderness: There is no abdominal tenderness.  Musculoskeletal:     Cervical back: Neck supple.  Skin:    General: Skin is warm and dry.  Neurological:     Mental Status: She is alert.     Comments: AAOx3 CN 2-12 intact, speech clear visual fields intact Noted significant bilateral horizontal nystagmus as well as slight vertical nystagmus 5/5 strength in b/l UE and LE Sensation to light touch intact in b/l UE and LE Normal FNF     ED Results / Procedures / Treatments   Labs (all labs ordered are listed, but only abnormal results are displayed) Labs Reviewed  CBC WITH DIFFERENTIAL/PLATELET - Abnormal; Notable for the following components:      Result Value   WBC 13.4 (*)    Hemoglobin 9.8 (*)    HCT 34.2 (*)    MCV 71.0 (*)    MCH 20.3 (*)    MCHC 28.7 (*)    RDW 19.5 (*)    Platelets 471 (*)    Lymphs Abs 5.3 (*)    Monocytes Absolute 1.1 (*)    Abs Immature Granulocytes 0.15 (*)    All other components within normal limits  COMPREHENSIVE METABOLIC PANEL - Abnormal; Notable for the following components:   Potassium 3.0 (*)    CO2 21 (*)    Glucose, Bld 211 (*)    Calcium 8.8 (*)    All other components within normal limits  URINALYSIS, ROUTINE W REFLEX MICROSCOPIC - Abnormal; Notable for the following components:   Color, Urine STRAW (*)    All other components within normal limits  LIPASE, BLOOD  I-STAT BETA HCG BLOOD, ED ([facility], [facility], [facility] ONLY)  TROPONIN I (HIGH SENSITIVITY)  TROPONIN I (HIGH SENSITIVITY)    EKG None  Radiology MR Venogram Head  Result Date: 08/12/2020 CLINICAL DATA:  SEVERE VERTIGO AND DIZZINESS. EXAM: MRI HEAD WITHOUT AND WITH CONTRAST MR VENOGRAM OF THE HEAD WITHOUT CONTRAST TECHNIQUE: Multiplanar, multiecho pulse  sequences of the brain and surrounding structures were obtained without and with intravenous contrast. Angiographic images of the intracranial venous structures were obtained using MRV technique without intravenous contrast. CONTRAST:  10mL GADAVIST GADOBUTROL 1 MMOL/ML IV SOLN COMPARISON:  CT head February 24, 2015. FINDINGS: MRI HEAD: Brain: No acute infarction, hemorrhage, hydrocephalus, extra-axial collection or mass lesion. 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 The posterior fossa is small, the foramen magnum is crowded, and the tonsils are somewhat  pointed in morphology. There are a few nonspecific T2/FLAIR hyperintensities within the white matter, not particularly advanced for age. IAC protocol was performed. Motion limits the high-resolution T2; however, visualized seventh and eighth cranial nerves are grossly unremarkable. No evidence of a retrocochlear mass or abnormal enhancement. No mastoid effusions. Unremarkable appearance of the adjacent dural venous sinuses and jugular bulbs normal CSF signal within the labyrinth bilaterally. Vascular: Major arterial flow voids are maintained at the skull base. No evidence of dural sinus thrombosis. Skull and upper cervical spine: Diffuse T1 hypointensity of the marrow, most likely related to the patient's reported history of anemia. Sinuses/Orbits: Clear sinuses.  Unremarkable orbits. Other: Abnormal thickening and enhancement of the posterior pharyngeal mucosa and the partially visualized prevertebral soft tissues in the upper cervical spine. Adenoid hypertrophy. MR VENOGRAM: No evidence of dural sinus thrombosis. IMPRESSION: 1. 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 While this does not meet standard criteria for a Chiari malformation, the posterior fossa is small, the foramen magnum is crowded, and the tonsils are somewhat pointed in morphology. Therefore, this could potentially be a cause of the patient's reported  symptoms. 2. Abnormal thickening and enhancement of the posterior pharyngeal mucosa and the partially visualized prevertebral soft tissues in the upper cervical spine which is of unclear significance. This could potentially represent a severe pharyngitis/infection. Recommend correlation with direct inspection. CT of the neck could further evaluate if clinically indicated. 3. No evidence of dural sinus thrombosis or a retrocochlear mass. 4. Diffuse T1 hypointensity of the marrow, most likely related to the patient's reported history of anemia. Electronically Signed   By: Feliberto Harts MD   On: 08/12/2020 13:14   MR BRAIN/IAC W WO CONTRAST  Result Date: 08/12/2020 CLINICAL DATA:  SEVERE VERTIGO AND DIZZINESS. EXAM: MRI HEAD WITHOUT AND WITH CONTRAST MR VENOGRAM OF THE HEAD WITHOUT CONTRAST TECHNIQUE: Multiplanar, multiecho pulse sequences of the brain and surrounding structures were obtained without and with intravenous contrast. Angiographic images of the intracranial venous structures were obtained using MRV technique without intravenous contrast. CONTRAST:  51mL GADAVIST GADOBUTROL 1 MMOL/ML IV SOLN COMPARISON:  CT head February 24, 2015. FINDINGS: MRI HEAD: Brain: No acute infarction, hemorrhage, hydrocephalus, extra-axial collection or mass lesion. 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 The posterior fossa is small, the foramen magnum is crowded, and the tonsils are somewhat pointed in morphology. There are a few nonspecific T2/FLAIR hyperintensities within the white matter, not particularly advanced for age. IAC protocol was performed. Motion limits the high-resolution T2; however, visualized seventh and eighth cranial nerves are grossly unremarkable. No evidence of a retrocochlear mass or abnormal enhancement. No mastoid effusions. Unremarkable appearance of the adjacent dural venous sinuses and jugular bulbs normal CSF signal within the labyrinth bilaterally. Vascular: Major  arterial flow voids are maintained at the skull base. No evidence of dural sinus thrombosis. Skull and upper cervical spine: Diffuse T1 hypointensity of the marrow, most likely related to the patient's reported history of anemia. Sinuses/Orbits: Clear sinuses.  Unremarkable orbits. Other: Abnormal thickening and enhancement of the posterior pharyngeal mucosa and the partially visualized prevertebral soft tissues in the upper cervical spine. Adenoid hypertrophy. MR VENOGRAM: No evidence of dural sinus thrombosis. IMPRESSION: 1. 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 While this does not meet standard criteria for a Chiari malformation, the posterior fossa is small, the foramen magnum is crowded, and the tonsils are somewhat pointed in morphology. Therefore, this could potentially be a cause of the patient's reported  symptoms. 2. Abnormal thickening and enhancement of the posterior pharyngeal mucosa and the partially visualized prevertebral soft tissues in the upper cervical spine which is of unclear significance. This could potentially represent a severe pharyngitis/infection. Recommend correlation with direct inspection. CT of the neck could further evaluate if clinically indicated. 3. No evidence of dural sinus thrombosis or a retrocochlear mass. 4. Diffuse T1 hypointensity of the marrow, most likely related to the patient's reported history of anemia. Electronically Signed   By: Feliberto Harts MD   On: 08/12/2020 13:14    Procedures Procedures   Medications Ordered in ED Medications  prochlorperazine (COMPAZINE) injection 10 mg (10 mg Intravenous Given 08/12/20 1032)  diphenhydrAMINE (BENADRYL) injection 25 mg (25 mg Intravenous Given 08/12/20 1031)  gadobutrol (GADAVIST) 1 MMOL/ML injection 10 mL (10 mLs Intravenous Contrast Given 08/12/20 1207)  dexamethasone (DECADRON) injection 8 mg (8 mg Intravenous Given 08/12/20 1425)    ED Course  I have reviewed the triage vital signs  and the nursing notes.  Pertinent labs & imaging results that were available during my care of the patient were reviewed by me and considered in my medical decision making (see chart for details).  Clinical Course as of 08/13/20 4235  Fri Aug 12, 2020  1046 Discussed imaging selection with kirkpatrick - rec MR Brain and IAC as well as MR venogram based on history and physical [RD]  1410 Reviewed results in detail with Dr. Amada Jupiter, he feels it is unlikely that the MRI findings can explain patient's symptoms.  Suspect t the possible Chiari malformation is more likely incidental.  If patient continues to have severe symptoms then would consider ER to ER transfer for in person neuro eval but if symptoms are improving then would consider discharge and outpatient management.  Recommends trial of steroids for possible labyrinthitis [RD]    Clinical Course User Index [RD] Milagros Loll, MD   MDM Rules/Calculators/A&P                         25 year old lady presented to the emergency room with concern for severe nausea with vomiting and dizziness.  On physical exam, patient initially appeared uncomfortable, had a normal neurologic exam except noted significant nystagmus.  Worse horizontally but also question slight vertical nystagmus.  Described room spinning sensation.  Concern for vertigo.  No abdominal pain and no tenderness on serial exam.  Given the constant and abrupt onset nature of this vertigo, I discussed obtaining advanced neuroimaging with neurology.  Dr.Kirkpatrick provided recommendations for MRI brain with IAC and MR venogram.  Patient in the interim was provided headache cocktail/antiemetics.  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  Discussed this finding with Dr. Amada Jupiter, based on symptomatology and his review of the images, he suspects this is an incidental finding and highly unlikely that this caused her vertigo.  He recommended trial of steroids in case of possible  labyrinthitis for cause of her vertigo.  The radiologist also commented on abnormal enhancement of the posterior pharyngeal mucosa.  Patient had no specific throat or neck symptoms, carefully inspected her throat and posterior oropharynx and neck and did not appreciate any abnormality and patient confirmed that she was asymptomatic.  Therefore I do not feel any further advanced imaging is clinically indicated at present.  On later reassessment, patient symptoms had completely resolved.  Nystagmus have resolved.  Based on my discussions with neurology and imaging, suspect peripheral cause for her vertigo.  Given her symptoms are  well controlled at present, vitals are stable and she appears well, believe she is appropriate for outpatient management.  Recommend that she follow-up with her primary care doctor as well as neurosurgery to discuss further monitoring of this possible Chiari malformation.   After the discussed management above, the patient was determined to be safe for discharge.  The patient was in agreement with this plan and all questions regarding their care were answered.  ED return precautions were discussed and the patient will return to the ED with any significant worsening of condition.   Final Clinical Impression(s) / ED Diagnoses Final diagnoses:  Vertigo    Rx / DC Orders ED Discharge Orders         Ordered    meclizine (ANTIVERT) 25 MG tablet  3 times daily PRN        08/12/20 1549           Milagros Loll, MD 08/13/20 [phone]

## 2020-08-12 NOTE — ED Triage Notes (Signed)
Pt arrived via POV, c/o n/v diarrhea since this morning. Believes it might be related to eating some bad salmon last night.

## 2020-08-12 NOTE — ED Notes (Signed)
Pt transported to MRI 

## 2020-08-17 ENCOUNTER — Encounter: Payer: Self-pay | Admitting: Neurology

## 2020-09-08 NOTE — Progress Notes (Signed)
NEUROLOGY CONSULTATION NOTE  Samantha Allen MRN: [id number] DOB: [birth year]  Referring provider: Cindra Presume, PA-C Primary care provider: No PCP  Reason for consult:  migraine  Assessment/Plan:   1.  Vestibular migraine 2.  Chronic migraine without aura, without status migrainosus, not intractable 3.  Cerebellar tonsillar ectopia - does not meet criteria of Chiari and given that the episode of vertigo was transient, not likely related.  However, her migraines are aggravated by valsalva, which possibly is related.    1.  Will start topiramate 50mg  at bedtime.  Acetazolamide may be helpful for cough headaches but as topiramate is in the same family and is better at treating multiple headache-types, will start with this.  2.  For rescue, rizatriptan MLT 10mg  and Zofran ODT 4mg . 3.  Advised to stop over the counter analgesics and caffeine 4.  Limit use of pain relievers to no more than 2 days out of week to prevent risk of rebound or medication-overuse headache. 5.  Keep headache diary 6.  Will refer to ophthalmology for evaluation of papilledema. 7.  Follow up 6 months.  Subjective:  Samantha Allen is a 25 year old right-handed female with asthma who presents for migraines.  History supplemented by ED and referring provider's notes.  In late April, she developed severe dizziness.  She described as a spinning sensation with severe headache, nausea, vomiting, diaphoresis.  The headache was a constant sharp pain in the temples and occipital region.  After a couple of days, she went to the ED on 4/29.  She was noted to have nystagmus on exam.  MRI of brain, personally reviewed, revealed 2-3 mm cerebellar tonsillar ectopia with possible crowding at the foramen magnum.  She was referred to neurosurgery for further evaluation who did not feel symptoms were related.   She reports history of migraines since 8th or 9th grade.  Headaches are similar.  They are associated with nausea, photophobia,  phonophobia and blurred vision/sees spots.  They last from 1 hour to several hours (if she wakes up with migraine) and occur 4 to 5 days a week.  She takes Tylenol or Excedrin to help relieve them.  Coughing and sneezing may trigger them.   Current NSAIDS/analgesics:  Tylenol Current triptans:  none Current ergotamine:  none Current anti-emetic:  none Current muscle relaxants:  none Current Antihypertensive medications:  none Current Antidepressant medications:  none Current Anticonvulsant medications:  none Current anti-CGRP:  none Current Vitamins/Herbal/Supplements:  none Current Antihistamines/Decongestants:  meclizine Other therapy:  none Hormone/birth control:  none  Reports that she tried 1 or 2 preventatives years ago that caused hives but does not remember the names. Past NSAIDS/analgesics:  Naproxen, ibuprofen, Excedrin Past abortive triptans:  frovatriptan Past abortive ergotamine:  none Past muscle relaxants:  none Past anti-emetic:  none Past antihypertensive medications:  none Past antidepressant medications:  none Past anticonvulsant medications:  none Past anti-CGRP:  none Past vitamins/Herbal/Supplements:  none Past antihistamines/decongestants: none Other past therapies:  none  Caffeine:  Pepsi daily.  No coffee Diet:  Hydrates with water.  Skips meals Exercise:  no Depression:  no; Anxiety:  no Other pain:  no Sleep hygiene:  no Family history of headache:  No      PAST MEDICAL HISTORY: Past Medical History:  Diagnosis Date  . Asthma   . Pain from implanted hardware 11/2011   retained syndesmosis screw right ankle    PAST SURGICAL HISTORY: Past Surgical History:  Procedure Laterality Date  . CHOLECYSTECTOMY    .  HARDWARE REMOVAL  11/22/2011   Procedure: HARDWARE REMOVAL;  Surgeon: Toni Arthurs, MD;  Location: [facility];  Service: Orthopedics;  Laterality: Right;  . ORIF ANKLE FRACTURE  07/2011   right  . TONSILLECTOMY       MEDICATIONS: Current Outpatient Medications on File Prior to Visit  Medication Sig Dispense Refill  . acetaminophen (TYLENOL) 325 MG tablet Take 650 mg by mouth every 6 (six) hours as needed for mild pain, fever or headache.    . albuterol (PROVENTIL HFA;VENTOLIN HFA) 108 (90 BASE) MCG/ACT inhaler Inhale 1-2 puffs into the lungs every 6 (six) hours as needed for wheezing or shortness of breath. 1 Inhaler 0  . meclizine (ANTIVERT) 25 MG tablet Take 1 tablet (25 mg total) by mouth 3 (three) times daily as needed for dizziness. 30 tablet 0   No current facility-administered medications on file prior to visit.    ALLERGIES: Allergies  Allergen Reactions  . [place] Gum [Fish Allergy] Anaphylaxis    FAMILY HISTORY: Family History  Problem Relation Age of Onset  . Anesthesia problems Mother        post-op N/V  . Hypertension Mother   . Diabetes Father     Objective:  Blood pressure 127/86, pulse 97, height 5\' 3"  (1.6 m), weight (!) 320 lb 6.4 oz (145.3 kg), SpO2 97 %. General: No acute distress.  Patient appears well-groomed.   Head:  Normocephalic/atraumatic Eyes:  fundi examined but not visualized Neck: supple, no paraspinal tenderness, full range of motion Back: No paraspinal tenderness Heart: regular rate and rhythm Lungs: Clear to auscultation bilaterally. Vascular: No carotid bruits. Neurological Exam: Mental status: alert and oriented to person, place, and time, recent and remote memory intact, fund of knowledge intact, attention and concentration intact, speech fluent and not dysarthric, language intact. Cranial nerves: CN I: not tested CN II: pupils equal, round and reactive to light, visual fields intact CN III, IV, VI:  full range of motion, no nystagmus, no ptosis CN V: facial sensation intact. CN VII: upper and lower face symmetric CN VIII: hearing intact CN IX, X: gag intact, uvula midline CN XI: sternocleidomastoid and trapezius muscles intact CN XII: tongue  midline Bulk & Tone: normal, no fasciculations. Motor:  muscle strength 5/5 throughout Sensation:  Pinprick, temperature and vibratory sensation intact. Deep Tendon Reflexes:  2+ throughout,  toes downgoing.   Finger to nose testing:  Without dysmetria.   Heel to shin:  Without dysmetria.   Gait:  Normal station and stride.  Romberg negative.    Thank you for allowing me to take part in the care of this patient.  , DO  CC: Shon Millet, PA-C

## 2020-09-09 ENCOUNTER — Other Ambulatory Visit: Payer: Self-pay

## 2020-09-09 ENCOUNTER — Ambulatory Visit (INDEPENDENT_AMBULATORY_CARE_PROVIDER_SITE_OTHER): Payer: 59 | Admitting: Neurology

## 2020-09-09 ENCOUNTER — Encounter: Payer: Self-pay | Admitting: Neurology

## 2020-09-09 VITALS — BP 127/86 | HR 97 | Ht 63.0 in | Wt 320.4 lb

## 2020-09-09 DIAGNOSIS — H538 Other visual disturbances: Secondary | ICD-10-CM | POA: Diagnosis not present

## 2020-09-09 DIAGNOSIS — G43709 Chronic migraine without aura, not intractable, without status migrainosus: Secondary | ICD-10-CM

## 2020-09-09 DIAGNOSIS — Q048 Other specified congenital malformations of brain: Secondary | ICD-10-CM | POA: Diagnosis not present

## 2020-09-09 DIAGNOSIS — G43809 Other migraine, not intractable, without status migrainosus: Secondary | ICD-10-CM

## 2020-09-09 MED ORDER — TOPIRAMATE 50 MG PO TABS: ORAL_TABLET | ORAL | 0 refills | Status: AC

## 2020-09-09 MED ORDER — ONDANSETRON 4 MG PO TBDP: 4.0000 mg | ORAL_TABLET | Freq: Three times a day (TID) | ORAL | 5 refills | Status: AC | PRN

## 2020-09-09 MED ORDER — RIZATRIPTAN BENZOATE 10 MG PO TBDP: ORAL_TABLET | ORAL | 5 refills | Status: AC

## 2020-09-09 NOTE — Patient Instructions (Signed)
  1. Refer to eye doctor 2. Start topiramate 50mg .  Take 1/2 tablet at bedtime for one week, then 1 tablet at bedtime  Contact in 5 weeks with update and we can increase dose if needed. 3. Take rizatriptan 10mg  at earliest onset of headache.  May repeat dose once in 2 hours if needed.  Maximum 2 tablets in 24 hours. 4. Take ondansetron for nausea 5. Stop Tylenol and Excedrin 6. Limit use of pain relievers to no more than 2 days out of the week.  These medications include acetaminophen, NSAIDs (ibuprofen/Advil/Motrin, naproxen/Aleve, triptans (Imitrex/sumatriptan), Excedrin, and narcotics.  This will help reduce risk of rebound headaches. 7. Be aware of common food triggers:  - Caffeine:  STOP ALL CAFFEINE (PEPSI)  - Chocolate  - Dairy:  aged cheeses (brie, blue, cheddar, gouda, Parmasan, provolone, [place], Swiss, etc), chocolate milk, buttermilk, sour cream, limit eggs and yogurt  - Nuts, peanut butter  - Alcohol  - Cereals/grains:  FRESH breads (fresh bagels, sourdough, doughnuts), yeast productions  - Processed/canned/aged/cured meats (pre-packaged deli meats, hotdogs)  - MSG/glutamate:  soy sauce, flavor enhancer, pickled/preserved/marinated foods  - Sweeteners:  aspartame (Equal, Nutrasweet).  Sugar and Splenda are okay  - Vegetables:  legumes (lima beans, lentils, snow peas, fava beans, pinto peans, peas, garbanzo beans), sauerkraut, onions, olives, pickles  - Fruit:  avocados, bananas, citrus fruit (orange, lemon, grapefruit), mango  - Other:  Frozen meals, macaroni and cheese 8. Routine exercise 9. Stay adequately hydrated (aim for 64 oz water daily) 10. Keep headache diary 11. Maintain proper stress management 12. Maintain proper sleep hygiene 13. Do not skip meals 14. Consider supplements:  magnesium citrate 400mg  daily, riboflavin 400mg  daily, coenzyme Q10 100mg  three times daily.

## 2020-09-28 ENCOUNTER — Telehealth: Payer: Self-pay | Admitting: Neurology

## 2020-09-28 DIAGNOSIS — H538 Other visual disturbances: Secondary | ICD-10-CM

## 2020-09-28 NOTE — Telephone Encounter (Signed)
Toney Reil was sent a referral for Samantha Allen, but they dont take [facility]. She said she recommended [facility]. She wasn't sure if you wanted to fax a referral over.

## 2020-09-29 NOTE — Telephone Encounter (Signed)
New referral added, [facility]

## 2021-03-17 NOTE — Progress Notes (Deleted)
NEUROLOGY FOLLOW UP OFFICE NOTE  Samantha Allen [id number]  Assessment/Plan:   1.  Vestibular migraine 2.  Chronic migraine without aura, without status migrainosus, not intractable 3.  Cerebellar tonsillar ectopia - does not meet criteria of Chiari and given that the episode of vertigo was transient, not likely related.  However, her migraines are aggravated by valsalva, which possibly is related.     1.  Will start topiramate 50mg  at bedtime.  Acetazolamide may be helpful for cough headaches but as topiramate is in the same family and is better at treating multiple headache-types, will start with this.  2.  For rescue, rizatriptan MLT 10mg  and Zofran ODT 4mg . 3.  Advised to stop over the counter analgesics and caffeine 4.  Limit use of pain relievers to no more than 2 days out of week to prevent risk of rebound or medication-overuse headache. 5.  Keep headache diary 6.  Will refer to ophthalmology for evaluation of papilledema. 7.  Follow up 6 months.  Subjective:  Samantha Allen is a 25 year old right-handed female with asthma who follows up for migraines.  UPDATE: Prescribed topiramate in May Intensity:  *** Duration:  *** Frequency:  ***  She was referred to ophthalmology ***  Frequency of abortive medication: *** Current NSAIDS/analgesics:  Tylenol Current triptans:  Maxalt MLT 10mg  Current ergotamine:  none Current anti-emetic:  Zofran ODT 4mg  Current muscle relaxants:  none Current Antihypertensive medications:  none Current Antidepressant medications:  none Current Anticonvulsant medications:  topiramate 50mg  at bedtime Current anti-CGRP:  none Current Vitamins/Herbal/Supplements:  none Current Antihistamines/Decongestants:  meclizine Other therapy:  none Hormone/birth control:  none  Caffeine:  Pepsi daily.  No coffee Diet:  Hydrates with water.  Skips meals Exercise:  no Depression:  no; Anxiety:  no Other pain:  no Sleep hygiene:  no  HISTORY:  In late  April 2022, she developed severe dizziness.  She described as a spinning sensation with severe headache, nausea, vomiting, diaphoresis.  The headache was a constant sharp pain in the temples and occipital region.  After a couple of days, she went to the ED on 4/29.  She was noted to have nystagmus on exam.  MRI of brain, personally reviewed, revealed 2-3 mm cerebellar tonsillar ectopia with possible crowding at the foramen magnum.  She was referred to neurosurgery for further evaluation who did not feel symptoms were related.    She reports history of migraines since 8th or 9th grade.  Headaches are similar.  They are associated with nausea, photophobia, phonophobia and blurred vision/sees spots.  They last from 1 hour to several hours (if she wakes up with migraine) and occur 4 to 5 days a week.  She takes Tylenol or Excedrin to help relieve them.  Coughing and sneezing may trigger them.     Reports that she tried 1 or 2 preventatives years ago that caused hives but does not remember the names. Past NSAIDS/analgesics:  Naproxen, ibuprofen, Excedrin Past abortive triptans:  frovatriptan Past abortive ergotamine:  none Past muscle relaxants:  none Past anti-emetic:  none Past antihypertensive medications:  none Past antidepressant medications:  none Past anticonvulsant medications:  none Past anti-CGRP:  none Past vitamins/Herbal/Supplements:  none Past antihistamines/decongestants: none Other past therapies:  none    Family history of headache:  No  PAST MEDICAL HISTORY: Past Medical History:  Diagnosis Date   Asthma    Pain from implanted hardware 11/2011   retained syndesmosis screw right ankle    MEDICATIONS:  Current Outpatient Medications on File Prior to Visit  Medication Sig Dispense Refill   acetaminophen (TYLENOL) 325 MG tablet Take 650 mg by mouth every 6 (six) hours as needed for mild pain, fever or headache. (Patient not taking: Reported on 09/09/2020)     albuterol  (PROVENTIL HFA;VENTOLIN HFA) 108 (90 BASE) MCG/ACT inhaler Inhale 1-2 puffs into the lungs every 6 (six) hours as needed for wheezing or shortness of breath. 1 Inhaler 0   meclizine (ANTIVERT) 25 MG tablet Take 1 tablet (25 mg total) by mouth 3 (three) times daily as needed for dizziness. 30 tablet 0   ondansetron (ZOFRAN ODT) 4 MG disintegrating tablet Take 1 tablet (4 mg total) by mouth every 8 (eight) hours as needed for nausea or vomiting. 20 tablet 5   rizatriptan (MAXALT-MLT) 10 MG disintegrating tablet Take 1 tablet earliest onset of headache.  May repeat in 2 hours if needed.  Maximum 2 tablets in 2 hours. 9 tablet 5   topiramate (TOPAMAX) 50 MG tablet Take 1/2 tablet at bedtime for one week, then increase to 1 tablet at bedtime. 30 tablet 0   No current facility-administered medications on file prior to visit.    ALLERGIES: Allergies  Allergen Reactions   [place] Gum [Fish Allergy] Anaphylaxis    FAMILY HISTORY: Family History  Problem Relation Age of Onset   Anesthesia problems Mother        post-op N/V   Hypertension Mother    Diabetes Father       Objective:  *** General: No acute distress.  Patient appears ***-groomed.   Head:  Normocephalic/atraumatic Eyes:  Fundi examined but not visualized Neck: supple, no paraspinal tenderness, full range of motion Heart:  Regular rate and rhythm Lungs:  Clear to auscultation bilaterally Back: No paraspinal tenderness Neurological Exam: alert and oriented to person, place, and time.  Speech fluent and not dysarthric, language intact.  CN II-XII intact. Bulk and tone normal, muscle strength 5/5 throughout.  Sensation to light touch intact.  Deep tendon reflexes 2+ throughout, toes downgoing.  Finger to nose testing intact.  Gait normal, Romberg negative.   Shon Millet, DO  CC: ***

## 2021-03-20 ENCOUNTER — Ambulatory Visit: Payer: 59 | Admitting: Neurology

## 2022-04-16 DIAGNOSIS — I517 Cardiomegaly: Secondary | ICD-10-CM | POA: Diagnosis not present

## 2022-04-16 DIAGNOSIS — R519 Headache, unspecified: Secondary | ICD-10-CM | POA: Diagnosis not present

## 2022-04-16 DIAGNOSIS — F17210 Nicotine dependence, cigarettes, uncomplicated: Secondary | ICD-10-CM | POA: Diagnosis not present

## 2022-04-16 DIAGNOSIS — G44209 Tension-type headache, unspecified, not intractable: Secondary | ICD-10-CM | POA: Diagnosis not present

## 2022-04-16 DIAGNOSIS — Z20822 Contact with and (suspected) exposure to covid-19: Secondary | ICD-10-CM | POA: Diagnosis not present

## 2022-04-16 DIAGNOSIS — R0789 Other chest pain: Secondary | ICD-10-CM | POA: Diagnosis not present

## 2022-04-16 DIAGNOSIS — R0602 Shortness of breath: Secondary | ICD-10-CM | POA: Diagnosis not present

## 2022-04-16 DIAGNOSIS — H538 Other visual disturbances: Secondary | ICD-10-CM | POA: Diagnosis not present

## 2022-04-16 DIAGNOSIS — E669 Obesity, unspecified: Secondary | ICD-10-CM | POA: Diagnosis not present

## 2022-04-16 DIAGNOSIS — F17290 Nicotine dependence, other tobacco product, uncomplicated: Secondary | ICD-10-CM | POA: Diagnosis not present

## 2022-04-19 DIAGNOSIS — F411 Generalized anxiety disorder: Secondary | ICD-10-CM | POA: Diagnosis not present

## 2022-04-19 DIAGNOSIS — I10 Essential (primary) hypertension: Secondary | ICD-10-CM | POA: Diagnosis not present

## 2022-04-19 DIAGNOSIS — R002 Palpitations: Secondary | ICD-10-CM | POA: Diagnosis not present

## 2022-04-19 DIAGNOSIS — R69 Illness, unspecified: Secondary | ICD-10-CM | POA: Diagnosis not present

## 2022-04-21 DIAGNOSIS — R079 Chest pain, unspecified: Secondary | ICD-10-CM | POA: Diagnosis not present

## 2022-04-21 DIAGNOSIS — R69 Illness, unspecified: Secondary | ICD-10-CM | POA: Diagnosis not present

## 2022-04-21 DIAGNOSIS — I10 Essential (primary) hypertension: Secondary | ICD-10-CM | POA: Diagnosis not present

## 2022-04-21 DIAGNOSIS — D649 Anemia, unspecified: Secondary | ICD-10-CM | POA: Diagnosis not present

## 2022-04-21 DIAGNOSIS — R000 Tachycardia, unspecified: Secondary | ICD-10-CM | POA: Diagnosis not present

## 2022-04-21 DIAGNOSIS — I517 Cardiomegaly: Secondary | ICD-10-CM | POA: Diagnosis not present

## 2022-04-21 DIAGNOSIS — F32A Depression, unspecified: Secondary | ICD-10-CM | POA: Diagnosis not present

## 2022-04-22 DIAGNOSIS — Z008 Encounter for other general examination: Secondary | ICD-10-CM | POA: Diagnosis not present

## 2022-04-22 DIAGNOSIS — R69 Illness, unspecified: Secondary | ICD-10-CM | POA: Diagnosis not present

## 2022-04-30 DIAGNOSIS — R7989 Other specified abnormal findings of blood chemistry: Secondary | ICD-10-CM | POA: Diagnosis not present

## 2022-04-30 DIAGNOSIS — R0602 Shortness of breath: Secondary | ICD-10-CM | POA: Diagnosis not present

## 2022-04-30 DIAGNOSIS — R09A2 Foreign body sensation, throat: Secondary | ICD-10-CM | POA: Diagnosis not present

## 2022-04-30 DIAGNOSIS — R002 Palpitations: Secondary | ICD-10-CM | POA: Diagnosis not present

## 2022-04-30 DIAGNOSIS — R0789 Other chest pain: Secondary | ICD-10-CM | POA: Diagnosis not present

## 2022-05-01 DIAGNOSIS — R09A2 Foreign body sensation, throat: Secondary | ICD-10-CM | POA: Diagnosis not present

## 2022-05-01 DIAGNOSIS — R0789 Other chest pain: Secondary | ICD-10-CM | POA: Diagnosis not present

## 2022-05-01 DIAGNOSIS — R7989 Other specified abnormal findings of blood chemistry: Secondary | ICD-10-CM | POA: Diagnosis not present

## 2022-05-01 DIAGNOSIS — R002 Palpitations: Secondary | ICD-10-CM | POA: Diagnosis not present

## 2022-05-01 DIAGNOSIS — R0602 Shortness of breath: Secondary | ICD-10-CM | POA: Diagnosis not present

## 2022-05-03 DIAGNOSIS — F39 Unspecified mood [affective] disorder: Secondary | ICD-10-CM | POA: Diagnosis not present

## 2022-05-03 DIAGNOSIS — I219 Acute myocardial infarction, unspecified: Secondary | ICD-10-CM | POA: Diagnosis not present

## 2022-05-03 DIAGNOSIS — R0602 Shortness of breath: Secondary | ICD-10-CM | POA: Diagnosis not present

## 2022-05-03 DIAGNOSIS — R69 Illness, unspecified: Secondary | ICD-10-CM | POA: Diagnosis not present

## 2022-05-03 DIAGNOSIS — R0789 Other chest pain: Secondary | ICD-10-CM | POA: Diagnosis not present

## 2022-05-04 DIAGNOSIS — F05 Delirium due to known physiological condition: Secondary | ICD-10-CM | POA: Diagnosis not present

## 2022-05-04 DIAGNOSIS — Z743 Need for continuous supervision: Secondary | ICD-10-CM | POA: Diagnosis not present

## 2022-05-14 DIAGNOSIS — R69 Illness, unspecified: Secondary | ICD-10-CM | POA: Diagnosis not present

## 2022-05-16 DIAGNOSIS — I10 Essential (primary) hypertension: Secondary | ICD-10-CM | POA: Diagnosis not present

## 2022-05-16 DIAGNOSIS — F330 Major depressive disorder, recurrent, mild: Secondary | ICD-10-CM | POA: Diagnosis not present

## 2022-05-16 DIAGNOSIS — R69 Illness, unspecified: Secondary | ICD-10-CM | POA: Diagnosis not present

## 2022-05-16 DIAGNOSIS — Z79899 Other long term (current) drug therapy: Secondary | ICD-10-CM | POA: Diagnosis not present

## 2022-07-14 ENCOUNTER — Emergency Department (HOSPITAL_BASED_OUTPATIENT_CLINIC_OR_DEPARTMENT_OTHER)
Admission: EM | Admit: 2022-07-14 | Discharge: 2022-07-14 | Disposition: A | Discharge status: Home / Self Care | Payer: BLUE CROSS/BLUE SHIELD | Attending: Emergency Medicine | Admitting: Emergency Medicine

## 2022-07-14 ENCOUNTER — Encounter (HOSPITAL_BASED_OUTPATIENT_CLINIC_OR_DEPARTMENT_OTHER): Payer: Self-pay | Admitting: Emergency Medicine

## 2022-07-14 DIAGNOSIS — U071 COVID-19: Secondary | ICD-10-CM | POA: Insufficient documentation

## 2022-07-14 DIAGNOSIS — J069 Acute upper respiratory infection, unspecified: Secondary | ICD-10-CM | POA: Insufficient documentation

## 2022-07-14 DIAGNOSIS — R509 Fever, unspecified: Secondary | ICD-10-CM | POA: Diagnosis present

## 2022-07-14 LAB — RESP PANEL BY RT-PCR (RSV, FLU A&B, COVID)  RVPGX2
Influenza A by PCR: NEGATIVE
Influenza B by PCR: NEGATIVE
Resp Syncytial Virus by PCR: NEGATIVE
SARS Coronavirus 2 by RT PCR: POSITIVE — AB

## 2022-07-14 LAB — GROUP A STREP BY PCR: Group A Strep by PCR: NOT DETECTED

## 2022-07-14 MED ORDER — ACETAMINOPHEN 325 MG PO TABS
650.0000 mg | ORAL_TABLET | Freq: Once | ORAL | Status: AC
  Administered 2022-07-14: 650 mg via ORAL
  Filled 2022-07-14: qty 2

## 2022-07-14 NOTE — ED Notes (Signed)
Pt received AVS; Pt educated on OTC symptom management and hydration. Pt advised to return if symptoms worsen or she become SOB. Pt verbalized understanding and no further questions upon discharge.

## 2022-07-14 NOTE — ED Provider Notes (Signed)
[facility] Provider Note   CSN: [id number] Arrival date & time: 07/14/22  1726     History  Chief Complaint  Patient presents with   Fever    Samantha Allen is a 27 y.o. female presenting with URI symptoms since she woke up this morning.  No trouble breathing.  Inclusive of fevers, chills, congestion, sore throat, rhinorrhea.  People at work are sick.   Fever      Home Medications Prior to Admission medications   Medication Sig Start Date End Date Taking? Authorizing Provider  acetaminophen (TYLENOL) 325 MG tablet Take 650 mg by mouth every 6 (six) hours as needed for mild pain, fever or headache. Patient not taking: Reported on 09/09/2020    [provider]  albuterol (PROVENTIL HFA;VENTOLIN HFA) 108 (90 BASE) MCG/ACT inhaler Inhale 1-2 puffs into the lungs every 6 (six) hours as needed for wheezing or shortness of breath. 08/19/14   Delos Haring, PA-C  meclizine (ANTIVERT) 25 MG tablet Take 1 tablet (25 mg total) by mouth 3 (three) times daily as needed for dizziness. 08/12/20   Lucrezia Starch, MD  ondansetron (ZOFRAN ODT) 4 MG disintegrating tablet Take 1 tablet (4 mg total) by mouth every 8 (eight) hours as needed for nausea or vomiting. 09/09/20   Pieter Partridge, DO  rizatriptan (MAXALT-MLT) 10 MG disintegrating tablet Take 1 tablet earliest onset of headache.  May repeat in 2 hours if needed.  Maximum 2 tablets in 2 hours. 09/09/20   Pieter Partridge, DO  topiramate (TOPAMAX) 50 MG tablet Take 1/2 tablet at bedtime for one week, then increase to 1 tablet at bedtime. 09/09/20   Pieter Partridge, DO      [place] [fish allergy]    Review of Systems   Review of Systems  Constitutional:  Positive for fever.    Physical Exam Updated Vital Signs BP (!) 146/102   Pulse (!) 119   Temp (!) 103 F (39.4 C) (Oral)   Resp 20   LMP 07/08/2022   SpO2 94%  Physical Exam Vitals and nursing note reviewed.   Constitutional:      General: She is not in acute distress.    Appearance: Normal appearance. She is not ill-appearing.  HENT:     Head: Normocephalic and atraumatic.  Eyes:     General: No scleral icterus.    Conjunctiva/sclera: Conjunctivae normal.  Cardiovascular:     Rate and Rhythm: Regular rhythm. Tachycardia present.  Pulmonary:     Effort: Pulmonary effort is normal. No respiratory distress.     Breath sounds: No wheezing or rales.  Skin:    General: Skin is warm and dry.     Findings: No rash.  Neurological:     Mental Status: She is alert.  Psychiatric:        Mood and Affect: Mood normal.     ED Results / Procedures / Treatments   Labs (all labs ordered are listed, but only abnormal results are displayed) Labs Reviewed  RESP PANEL BY RT-PCR (RSV, FLU A&B, COVID)  RVPGX2 - Abnormal; Notable for the following components:      Result Value   SARS Coronavirus 2 by RT PCR POSITIVE (*)    All other components within normal limits  GROUP A STREP BY PCR    EKG None  Radiology No results found.  Procedures Procedures   Medications Ordered in ED Medications  acetaminophen (TYLENOL) tablet 650  mg (650 mg Oral Given 07/14/22 1752)    ED Course/ Medical Decision Making/ A&P                             Medical Decision Making Risk OTC drugs.   27 year old female presenting today with URI symptoms.  Symptoms have been going on for 12 hours.  On physical exam they are relatively well-appearing.  No tachypnea or hypoxia.  Mildly tachycardic likely secondary to her fever.  Tested positive for COVID. We discussed that COVID is something that needs to run its course and they may use ibuprofen, Tylenol, DayQuil/NyQuil and other over-the-counter medications for their symptoms.  Return precautions discussed and they understand that they may follow-up on their symptoms outpatient with either PCP or urgent care as needed for nonemergent symptoms.  Agreeable to discharge at  this time.    Final Clinical Impression(s) / ED Diagnoses Final diagnoses:  COVID-19    Rx / DC Orders ED Discharge Orders     None      Results and diagnoses were explained to the patient. Return precautions discussed in full. Patient had no additional questions and expressed complete understanding.   This chart was dictated using voice recognition software.  Despite best efforts to proofread,  errors can occur which can change the documentation meaning.    Rhae Hammock, PA-C 07/14/22 Clyde Canterbury, MD 07/15/22 1114

## 2022-07-14 NOTE — Discharge Instructions (Signed)
You came to the department today due to flulike symptoms.  You tested positive for covid.  This sort of viral illness is something that can be treated with over-the-counter medications like Tylenol and ibuprofen.  Other options include DayQuil/NyQuil, Mucinex for congestion, Robitussin/Delsym for cough and any other over-the-counter medications.  It is very important that you stay hydrated during this time as well.  You may use things like Powerade, Gatorade, electrolyte powders and water.  We hope that you feel better and do not hesitate to return to the emergency department with any worsening symptoms, especially chest pain, shortness of breath, dizziness and loss of consciousness.  

## 2022-07-14 NOTE — ED Triage Notes (Signed)
Fever sore throat, nasal congestion Started last night

## 2022-07-14 NOTE — ED Notes (Signed)
Pt provided tylenol for fever; updated on POC; Call light in reach, no other needs at this time.

## 2022-07-27 ENCOUNTER — Encounter (HOSPITAL_BASED_OUTPATIENT_CLINIC_OR_DEPARTMENT_OTHER): Payer: Self-pay | Admitting: Emergency Medicine

## 2022-07-27 ENCOUNTER — Other Ambulatory Visit: Payer: Self-pay

## 2022-07-27 DIAGNOSIS — J4521 Mild intermittent asthma with (acute) exacerbation: Secondary | ICD-10-CM | POA: Diagnosis not present

## 2022-07-27 DIAGNOSIS — R0602 Shortness of breath: Secondary | ICD-10-CM | POA: Diagnosis present

## 2022-07-27 DIAGNOSIS — D649 Anemia, unspecified: Secondary | ICD-10-CM | POA: Diagnosis not present

## 2022-07-27 MED ORDER — ALBUTEROL SULFATE (2.5 MG/3ML) 0.083% IN NEBU
INHALATION_SOLUTION | RESPIRATORY_TRACT | Status: AC
  Administered 2022-07-27: 2.5 mg via RESPIRATORY_TRACT
  Filled 2022-07-27: qty 3

## 2022-07-27 MED ORDER — IPRATROPIUM-ALBUTEROL 0.5-2.5 (3) MG/3ML IN SOLN
RESPIRATORY_TRACT | Status: AC
  Administered 2022-07-27: 3 mL via RESPIRATORY_TRACT
  Filled 2022-07-27: qty 3

## 2022-07-27 MED ORDER — ALBUTEROL SULFATE (2.5 MG/3ML) 0.083% IN NEBU: 2.5000 mg | INHALATION_SOLUTION | Freq: Once | RESPIRATORY_TRACT | Status: AC

## 2022-07-27 MED ORDER — IPRATROPIUM-ALBUTEROL 0.5-2.5 (3) MG/3ML IN SOLN: 3.0000 mL | Freq: Once | RESPIRATORY_TRACT | Status: AC

## 2022-07-27 MED ORDER — ALBUTEROL SULFATE HFA 108 (90 BASE) MCG/ACT IN AERS
2.0000 | INHALATION_SPRAY | RESPIRATORY_TRACT | Status: DC | PRN
  Administered 2022-07-28: 2 via RESPIRATORY_TRACT

## 2022-07-27 NOTE — ED Triage Notes (Signed)
Presents from home for SOB and sensation of "elephant on my chest" since being sick with covid 2 weeks ago. Speaking in full sentences at rest but becomes dyspneic while ambulating.   Denies fever, chills, new leg swelling  H/o asthma, htn. Only medication is atarax as needed for anxiety.

## 2022-07-28 ENCOUNTER — Emergency Department (HOSPITAL_BASED_OUTPATIENT_CLINIC_OR_DEPARTMENT_OTHER)
Admission: EM | Admit: 2022-07-28 | Discharge: 2022-07-28 | Disposition: A | Discharge status: Home / Self Care | Payer: BLUE CROSS/BLUE SHIELD | Attending: Emergency Medicine | Admitting: Emergency Medicine

## 2022-07-28 ENCOUNTER — Emergency Department (HOSPITAL_BASED_OUTPATIENT_CLINIC_OR_DEPARTMENT_OTHER): Payer: BLUE CROSS/BLUE SHIELD

## 2022-07-28 DIAGNOSIS — J4521 Mild intermittent asthma with (acute) exacerbation: Secondary | ICD-10-CM

## 2022-07-28 DIAGNOSIS — D649 Anemia, unspecified: Secondary | ICD-10-CM

## 2022-07-28 DIAGNOSIS — R0602 Shortness of breath: Secondary | ICD-10-CM

## 2022-07-28 LAB — TROPONIN I (HIGH SENSITIVITY)
Troponin I (High Sensitivity): 3 ng/L (ref <18)
Troponin I (High Sensitivity): 4 ng/L (ref <18)

## 2022-07-28 LAB — BASIC METABOLIC PANEL
Anion gap: 5 (ref 5–15)
BUN: 14 mg/dL (ref 6–20)
CO2: 29 mmol/L (ref 22–32)
Calcium: 9.2 mg/dL (ref 8.9–10.3)
Chloride: 105 mmol/L (ref 98–111)
Creatinine, Ser: 0.8 mg/dL (ref 0.44–1.00)
GFR, Estimated: >60 mL/min (ref >60)
Glucose, Bld: 102 mg/dL — ABNORMAL HIGH (ref 70–99)
Potassium: 4.1 mmol/L (ref 3.5–5.1)
Sodium: 139 mmol/L (ref 135–145)

## 2022-07-28 LAB — CBC
HCT: 28.2 % — ABNORMAL LOW (ref 36.0–46.0)
Hemoglobin: 8 g/dL — ABNORMAL LOW (ref 12.0–15.0)
MCH: 18.6 pg — ABNORMAL LOW (ref 26.0–34.0)
MCHC: 28.4 g/dL — ABNORMAL LOW (ref 30.0–36.0)
MCV: 65.6 fL — ABNORMAL LOW (ref 80.0–100.0)
Platelets: 402 10*3/uL — ABNORMAL HIGH (ref 150–400)
RBC: 4.3 MIL/uL (ref 3.87–5.11)
RDW: 21.9 % — ABNORMAL HIGH (ref 11.5–15.5)
WBC: 6.9 10*3/uL (ref 4.0–10.5)
nRBC: 0.3 % — ABNORMAL HIGH (ref 0.0–0.2)

## 2022-07-28 LAB — HCG, SERUM, QUALITATIVE: Preg, Serum: NEGATIVE

## 2022-07-28 MED ORDER — DEXAMETHASONE 10 MG/ML FOR PEDIATRIC ORAL USE
10.0000 mg | Freq: Once | INTRAMUSCULAR | Status: AC
  Administered 2022-07-28: 10 mg via ORAL
  Filled 2022-07-28: qty 1

## 2022-07-28 MED ORDER — AEROCHAMBER PLUS FLO-VU LARGE MISC
1.0000 | Freq: Once | Status: AC
  Administered 2022-07-28: 1
  Filled 2022-07-28: qty 1

## 2022-07-28 MED ORDER — IPRATROPIUM-ALBUTEROL 0.5-2.5 (3) MG/3ML IN SOLN
3.0000 mL | Freq: Once | RESPIRATORY_TRACT | Status: AC
  Administered 2022-07-28: 3 mL via RESPIRATORY_TRACT
  Filled 2022-07-28: qty 3

## 2022-07-28 MED ORDER — FERROUS SULFATE 325 (65 FE) MG PO TABS: 325.0000 mg | ORAL_TABLET | Freq: Every day | ORAL | 0 refills | Status: AC

## 2022-07-28 MED ORDER — ALBUTEROL SULFATE HFA 108 (90 BASE) MCG/ACT IN AERS
2.0000 | INHALATION_SPRAY | Freq: Once | RESPIRATORY_TRACT | Status: AC
  Administered 2022-07-28: 2 via RESPIRATORY_TRACT
  Filled 2022-07-28: qty 6.7

## 2022-07-28 NOTE — ED Notes (Signed)
RT assessed pt for SOB w/chest tightness. Pt states it feels like something is sitting on her chest. Pt BLBS diminished/tight in nature. RT gave 5/5, pt states her chest is feeling better not as tight. Pt respiratory status is stable w/no distress noted at this time. Pt has hx of asthma/reactive airway w/out PFT or pulmonologist. RT will continue to monitor while at [facility] ED.

## 2022-07-28 NOTE — ED Provider Notes (Signed)
Emergency Department Provider Note   I have reviewed the triage vital signs and the nursing notes.   HISTORY  Chief Complaint Shortness of Breath   HPI Samantha Allen is a 27 y.o. female with PMH of asthma emergency department evaluation of shortness of breath, worse with exertion.  Has a history of asthma and uses albuterol occasionally.  Not aware of triggers for her asthma.  No fevers or chills.  Describes some chest tightness but no sharp/pleuritic pain.  No near syncope.  She does note a history of heavy menstrual cycles but states her bleeding stopped 2 days ago. She does not follow with a PCP or Gyn currently due to new insurance. No fever/chills. No cough.    Past Medical History:  Diagnosis Date   Asthma    Pain from implanted hardware 11/2011   retained syndesmosis screw right ankle    Review of Systems  Constitutional: No fever/chills Cardiovascular: Denies chest pain. Respiratory: Positive shortness of breath. Gastrointestinal: No abdominal pain.  No nausea, no vomiting.  No diarrhea.  No constipation. Musculoskeletal: Negative for back pain. Skin: Negative for rash. Neurological: Negative for headaches, focal weakness or numbness.  ____________________________________________   PHYSICAL EXAM:  VITAL SIGNS: ED Triage Vitals  Enc Vitals Group     BP 07/27/22 2339 (!) 156/111     Pulse Rate 07/27/22 2339 85     Resp 07/27/22 2339 (!) 22     Temp 07/27/22 2339 (!) 97.3 F (36.3 C)     Temp src --      SpO2 07/27/22 2339 100 %     Weight 07/27/22 2337 (!) 320 lb (145.2 kg)   Constitutional: Alert and oriented. Well appearing and in no acute distress. Eyes: Conjunctivae are normal.  Head: Atraumatic. Nose: No congestion/rhinnorhea. Mouth/Throat: Mucous membranes are moist.  Neck: No stridor.   Cardiovascular: Normal rate, regular rhythm. Good peripheral circulation. Grossly normal heart sounds.   Respiratory: Slight increased respiratory effort.  No  retractions. Lungs with mild end-expiratory wheezing bilaterally. No rales or rhonchi.  Gastrointestinal: Soft and nontender. No distention.  Musculoskeletal: No lower extremity tenderness nor edema. No gross deformities of extremities. Neurologic:  Normal speech and language.  Skin:  Skin is warm, dry and intact. No rash noted.   ____________________________________________   LABS (all labs ordered are listed, but only abnormal results are displayed)  Labs Reviewed  BASIC METABOLIC PANEL - Abnormal; Notable for the following components:      Result Value   Glucose, Bld 102 (*)    All other components within normal limits  CBC - Abnormal; Notable for the following components:   Hemoglobin 8.0 (*)    HCT 28.2 (*)    MCV 65.6 (*)    MCH 18.6 (*)    MCHC 28.4 (*)    RDW 21.9 (*)    Platelets 402 (*)    nRBC 0.3 (*)    All other components within normal limits  HCG, SERUM, QUALITATIVE  TROPONIN I (HIGH SENSITIVITY)  TROPONIN I (HIGH SENSITIVITY)   ____________________________________________  EKG   EKG Interpretation  Date/Time:  Friday July 27 2022 23:38:07 EDT Ventricular Rate:  79 PR Interval:  164 QRS Duration: 106 QT Interval:  366 QTC Calculation: 419 R Axis:   79 Text Interpretation: Normal sinus rhythm Normal ECG When compared with ECG of 12-Aug-2020 10:32, ST no longer depressed in Inferior leads Nonspecific T wave abnormality has replaced inverted T waves in Inferior leads T wave inversion no  longer evident in Lateral leads Similar to prior tracing from 2022 Confirmed by Alona Bene [phone]) on 07/27/2022 11:56:36 PM        ____________________________________________  RADIOLOGY  DG Chest Port 1 View  Result Date: 07/28/2022 CLINICAL DATA:  Chest pain, shortness of breath. EXAM: PORTABLE CHEST 1 VIEW COMPARISON:  None Available. FINDINGS: The heart size and mediastinal contours are within normal limits. No consolidation, effusion, or pneumothorax. No  acute osseous abnormality. IMPRESSION: No active disease. Electronically Signed   By: Thornell Sartorius M.D.   On: 07/28/2022 01:53    ____________________________________________   PROCEDURES  Procedure(s) performed:   Procedures  CRITICAL CARE Performed by: Maia Plan Total critical care time: 35 minutes Critical care time was exclusive of separately billable procedures and treating other patients. Critical care was necessary to treat or prevent imminent or life-threatening deterioration. Critical care was time spent personally by me on the following activities: development of treatment plan with patient and/or surrogate as well as nursing, discussions with consultants, evaluation of patient's response to treatment, examination of patient, obtaining history from patient or surrogate, ordering and performing treatments and interventions, ordering and review of laboratory studies, ordering and review of radiographic studies, pulse oximetry and re-evaluation of patient's condition.  Alona Bene, MD Emergency Medicine  ____________________________________________   INITIAL IMPRESSION / ASSESSMENT AND PLAN / ED COURSE  Pertinent labs & imaging results that were available during my care of the patient were reviewed by me and considered in my medical decision making (see chart for details).   This patient is Presenting for Evaluation of SOB, which does require a range of treatment options, and is a complaint that involves a high risk of morbidity and mortality.  The Differential Diagnoses includes but is not exclusive to asthma, acute coronary syndrome, aortic dissection, pulmonary embolism, cardiac tamponade, community-acquired pneumonia, pericarditis, musculoskeletal chest wall pain, etc.  Critical Interventions-    Medications  albuterol (VENTOLIN HFA) 108 (90 Base) MCG/ACT inhaler 2 puff (2 puffs Inhalation Given 07/28/22 0213)  ipratropium-albuterol (DUONEB) 0.5-2.5 (3) MG/3ML  nebulizer solution 3 mL (3 mLs Nebulization Given 07/27/22 2358)  albuterol (PROVENTIL) (2.5 MG/3ML) 0.083% nebulizer solution 2.5 mg (2.5 mg Nebulization Given 07/27/22 2358)  dexamethasone (DECADRON) 10 MG/ML injection for Pediatric ORAL use 10 mg (10 mg Oral Given 07/28/22 0217)  albuterol (VENTOLIN HFA) 108 (90 Base) MCG/ACT inhaler 2 puff (2 puffs Inhalation Given by Other 07/28/22 0215)  AeroChamber Plus Flo-Vu Large MISC 1 each (1 each Other Given 07/28/22 0214)  ipratropium-albuterol (DUONEB) 0.5-2.5 (3) MG/3ML nebulizer solution 3 mL (3 mLs Nebulization Given 07/28/22 0242)    Reassessment after intervention: symptoms improved. Wheezing decreased.     Clinical Laboratory Tests Ordered, included Hb at 8 showing slight worsening anemia. Troponin negative. Pregnancy negative. No AKI.   Radiologic Tests Ordered, included CXR. I independently interpreted the images and agree with radiology interpretation.   Cardiac Monitor Tracing which shows NSR.    Social Determinants of Health Risk patient is not an active smoker.   Medical Decision Making: Summary:  Patient presents to the emergency department with shortness of breath.  Seems most consistent with asthma on exam with wheezing some increased work of breathing.  Very low suspicion for PE.  On reassessment patient is much more clear and subjectively improved.  She does have worsening anemia which we discussed likely from heavy menstrual cycles.  Discussed plan for close PCP and GYN follow-up.  Contact information provided at discharge along with iron supplementation.  Patient given Decadron here and will be sent home with MDI albuterol.   Considered admission but patient responding well to treatment.  No supplemental oxygen requirement.  Stable for discharge after multiple nebs and steroids.   Patient's presentation is most consistent with acute presentation with potential threat to life or bodily function.   Disposition:  discharge  ____________________________________________  FINAL CLINICAL IMPRESSION(S) / ED DIAGNOSES  Final diagnoses:  SOB (shortness of breath)  Anemia, unspecified type  Mild intermittent asthma with exacerbation     NEW OUTPATIENT MEDICATIONS STARTED DURING THIS VISIT:  Discharge Medication List as of 07/28/2022  2:53 AM     START taking these medications   Details  ferrous sulfate 325 (65 FE) MG tablet Take 1 tablet (325 mg total) by mouth daily., Starting Sat 07/28/2022, Normal        Note:  This document was prepared using Dragon voice recognition software and may include unintentional dictation errors.  Alona Bene, MD, [facility] Emergency Medicine    Takesha Steger, Arlyss Repress, MD 07/28/22 [phone]

## 2022-07-28 NOTE — Discharge Instructions (Signed)
You were seen in the ED today with trouble breathing. Your symptoms are likely from asthma. Please use your albuterol as prescribed. I have given a dose of steroid which will last for the next several days.   Your red blood cell count is low as well. I am starting iron supplements but I would like for you to follow with a primary care doctor for repeat labs ASAP along with an Ob/Gyn given your Ireanna Finlayson periods. This may be the cause of your low red blood counts.   Return to the ED with any new or worsening symptoms.
